# Patient Record
Sex: Male | Born: 1970 | Race: White | Hispanic: No | Marital: Married | Smoking: Former smoker
Health system: Southern US, Community
[De-identification: ages and names within clinical notes are randomized; demographics above are authoritative.]

## PROBLEM LIST (undated history)

## (undated) DIAGNOSIS — T7840XA Allergy, unspecified, initial encounter: Secondary | ICD-10-CM

## (undated) DIAGNOSIS — M545 Low back pain: Secondary | ICD-10-CM

## (undated) HISTORY — PX: VASECTOMY: SHX75

## (undated) HISTORY — DX: Allergy, unspecified, initial encounter: T78.40XA

## (undated) HISTORY — PX: SPINE SURGERY: SHX786

## (undated) HISTORY — DX: Low back pain: M54.5

## (undated) HISTORY — PX: BACK SURGERY: SHX140

---

## 1998-01-30 HISTORY — PX: OTHER SURGICAL HISTORY: SHX169

## 1998-02-16 ENCOUNTER — Encounter: Payer: Self-pay | Admitting: Orthopedic Surgery

## 1998-02-16 ENCOUNTER — Encounter: Payer: Self-pay | Admitting: Emergency Medicine

## 1998-02-16 ENCOUNTER — Emergency Department (HOSPITAL_COMMUNITY): Admission: EM | Admit: 1998-02-16 | Discharge: 1998-02-16 | Payer: Self-pay | Admitting: Emergency Medicine

## 2000-04-11 ENCOUNTER — Encounter: Payer: Self-pay | Admitting: Neurosurgery

## 2000-04-11 ENCOUNTER — Encounter: Admission: RE | Admit: 2000-04-11 | Discharge: 2000-04-11 | Payer: Self-pay | Admitting: Neurosurgery

## 2000-04-20 ENCOUNTER — Ambulatory Visit (HOSPITAL_COMMUNITY): Admission: RE | Admit: 2000-04-20 | Discharge: 2000-04-21 | Payer: Self-pay | Admitting: Neurosurgery

## 2008-04-07 ENCOUNTER — Ambulatory Visit: Payer: Self-pay | Admitting: Family Medicine

## 2008-11-19 ENCOUNTER — Telehealth: Payer: Self-pay | Admitting: Family Medicine

## 2009-04-01 ENCOUNTER — Ambulatory Visit: Payer: Self-pay | Admitting: Family Medicine

## 2009-04-01 DIAGNOSIS — M545 Low back pain, unspecified: Secondary | ICD-10-CM

## 2009-04-01 HISTORY — DX: Low back pain, unspecified: M54.50

## 2009-04-02 ENCOUNTER — Telehealth: Payer: Self-pay | Admitting: Family Medicine

## 2009-04-05 ENCOUNTER — Encounter: Admission: RE | Admit: 2009-04-05 | Discharge: 2009-04-05 | Payer: Self-pay | Admitting: Family Medicine

## 2009-04-06 ENCOUNTER — Telehealth: Payer: Self-pay | Admitting: Family Medicine

## 2009-04-13 ENCOUNTER — Telehealth: Payer: Self-pay | Admitting: Family Medicine

## 2009-04-15 ENCOUNTER — Encounter: Payer: Self-pay | Admitting: Family Medicine

## 2009-04-16 ENCOUNTER — Encounter: Admission: RE | Admit: 2009-04-16 | Discharge: 2009-04-16 | Payer: Self-pay | Admitting: Neurosurgery

## 2009-12-15 ENCOUNTER — Encounter: Admission: RE | Admit: 2009-12-15 | Discharge: 2009-12-15 | Payer: Self-pay | Admitting: Neurosurgery

## 2009-12-16 ENCOUNTER — Telehealth: Payer: Self-pay | Admitting: Family Medicine

## 2009-12-27 ENCOUNTER — Encounter: Payer: Self-pay | Admitting: Family Medicine

## 2010-01-04 ENCOUNTER — Telehealth: Payer: Self-pay | Admitting: Family Medicine

## 2010-03-01 NOTE — Progress Notes (Signed)
Summary: Pt req to get MRI sch. Pt wants Dr. Caryl Never to call first  Phone Note Call from Patient Call back at Work Phone [phone]   Caller: Patient Summary of Call: Pt called and wants Dr. Caryl Never to schedule an MRI. Pt wants Dr. Caryl Never to call pt first to discuss, before MRI is schdule.  Initial call taken by: Lucy Antigua,  April 02, 2009 11:50 AM  Follow-up for Phone Call        called and left message. Follow-up by: Evelena Peat MD,  April 02, 2009 2:41 PM  Additional Follow-up for Phone Call Additional follow up Details #1::        pt is return doc call Additional Follow-up by: Heron Sabins,  April 02, 2009 2:51 PM    Additional Follow-up for Phone Call Additional follow up Details #2::    Spoke with pt.  He has worsening pain into L buttock.  No numbness or wweakness.  No relief with Naprosyn. Will call in Vicodin #60 tablets 1-2 by mouth q4-6 hours as needed pain and try to set up MRI LS spine to further evaluate. Follow-up by: Evelena Peat MD,  April 02, 2009 4:57 PM  Additional Follow-up for Phone Call Additional follow up Details #3:: Details for Additional Follow-up Action Taken: Rx called in Additional Follow-up by: Sid Falcon LPN,  April 02, 2009 5:08 PM  New/Updated Medications: VICODIN 5-500 MG TABS (HYDROCODONE-ACETAMINOPHEN) one to two tabs every 4-6 hours as needed pain Prescriptions: VICODIN 5-500 MG TABS (HYDROCODONE-ACETAMINOPHEN) one to two tabs every 4-6 hours as needed pain  #60 x 0   Entered by:   Sid Falcon LPN   Authorized by:   Evelena Peat MD   Signed by:   Sid Falcon LPN on 09/81/1914   Method used:   Telephoned to ...       [facility] [address] (retail)       [address]       [place], [place]  [address]       Ph: [phone] or [phone]       Fax: [phone]   RxID:   [id number]

## 2010-03-01 NOTE — Consult Note (Signed)
Summary: [facility]  [facility]   Imported By: Maryln Gottron 04/23/2009 13:30:03  _____________________________________________________________________  External Attachment:    Type:   Image     Comment:   External Document

## 2010-03-01 NOTE — Assessment & Plan Note (Signed)
Summary: BACK PAIN // RS   Vital Signs:  Patient profile:   40 year old male Temp:     98.4 degrees F oral BP sitting:   122 / 62  (left arm) Cuff size:   regular  Vitals Entered By: Sid Falcon LPN (April 02, 2954 2:46 PM) CC: Back pain X 2 days   History of Present Illness: Acute visit. Low back pain which started yesterday. Patient started vigorous workout program couple weeks ago. No specific injury. Remote history of lumbosacral disc surgery 2002. Pain is sharp and moderate intensity left lower lumbar region and radiates toward the hamstring. No numbness or weakness. No loss of bladder or bowel control. Pain worse sitting. Lying supine is okay. No difficulties with sleep. Tried Advil without much improvement.  Allergies (verified): No Known Drug Allergies  Past History:  Past Medical History: Last updated: 04/07/2008 Grandmother breast cancer  Past Surgical History: Last updated: 04/07/2008 Broken left leg 2000 Ruptured Disc/Back Sugery 2002  Family History: Last updated: 04/07/2008 grandmother breastcancer  Social History: Last updated: 04/07/2008 Occupation:  Designer, multimedia, [employer] Married Never Smoked Alcohol use-yes  Risk Factors: Exercise: yes (04/07/2008)  Risk Factors: Smoking Status: never (04/07/2008)  Review of Systems  The patient denies anorexia, fever, weight loss, and incontinence.    Physical Exam  General:  Well-developed,well-nourished,in no acute distress; alert,appropriate and cooperative throughout examination Lungs:  Normal respiratory effort, chest expands symmetrically. Lungs are clear to auscultation, no crackles or wheezes. Heart:  Normal rate and regular rhythm. S1 and S2 normal without gallop, murmur, click, rub or other extra sounds. Msk:  no reproducible back tenderness. Left straight leg raise positive, negative right. Extremities:  no edema lower extremities. Neurologic:  full strength lower extremities and 2+  symmetric reflexes. Normal sensory function.   Detailed Back/Spine Exam  Lumbosacral Exam:  Inspection-deformity:    Normal Palpation-spinal tenderness:  Normal Range of Motion:    Forward Flexion:   90 degrees    Hyperextension:   15 degrees Squatting:  normal Lying Straight Leg Raise:    Right:  negative    Left:  positive Sciatic Notch:    There is left sciatic notch tenderness. Toe Walking:    Right:  normal    Left:  normal Heel Walking:    Right:  normal    Left:  normal   Impression & Recommendations:  Problem # 1:  LUMBAGO (ICD-724.2) Avoid heavy lifting and vigorous back flexion for now.  Cont aerobic activitly as tolerated and extension stretchs reviewed.  Trial of Naproxen. His updated medication list for this problem includes:    Naproxen 500 Mg Tabs (Naproxen) ..... One by mouth two times a day with food as needed back pain  Complete Medication List: 1)  Naproxen 500 Mg Tabs (Naproxen) .... One by mouth two times a day with food as needed back pain  Patient Instructions: 1)  Continue aerobic activity as tolerated. 2)  Followup promptly if he notices any numbness or weakness left lower extremity or if back pain increases in intensity. Prescriptions: NAPROXEN 500 MG TABS (NAPROXEN) one by mouth two times a day with food as needed back pain  #40 x 1   Entered and Authorized by:   Evelena Peat MD   Signed by:   Evelena Peat MD on 04/01/2009   Method used:   [facility]ron[facility]        [facility]  [address] [phone]* (retail)       [address]  [place], [place]  [address]       Ph: [phone] or [phone]       Fax: [phone]   RxID:   [id number]

## 2010-03-01 NOTE — [place] Notes (Signed)
Summary: Pt returned call. Pls call back.   Phone Note Call from Patient Call back at Home Phone [phone]   Caller: Patient Summary of Call: Pt called back and said that he was returning a call from Dr. Caryl Never. Pls return call.  Initial call taken by: Lucy Antigua,  January 04, 2010 4:10 PM  Follow-up for Phone Call        spoke with pt.  He has done well status post low back surgery. Follow-up by: Evelena Peat MD,  January 05, 2010 1:20 PM

## 2010-03-01 NOTE — [place] Notes (Signed)
Summary: please return call  Phone Note Call from Patient Call back at Work Phone [phone]   Caller: Patient---live call Summary of Call: Wants Dr Caryl Never to call him personally about a medicine. patient declined to leave further message. Initial call taken by: Warnell Forester,  December 16, 2009 10:25 AM  Follow-up for Phone Call        recurrent back pain.  refill Naproxen. Follow-up by: Evelena Peat MD,  December 16, 2009 2:53 PM    Prescriptions: NAPROXEN 500 MG TABS (NAPROXEN) one by mouth two times a day with food as needed back pain  #40 x 1   Entered and Authorized by:   Evelena Peat MD   Signed by:   Evelena Peat MD on 12/16/2009   Method used:   Electronically to        [facility] [phone]* (retail)       [address]       [place], [place]  [address]       Ph: [phone] or [phone]       Fax: [phone]   RxID:   [id number]

## 2010-03-01 NOTE — [place] Notes (Signed)
  Phone Note Call from Patient Call back at [facility] Phone [phone]   Summary of Call: continued back pain. Sees neurosurgeon Thursday.  Requesting refill Vicodin which provides moderate relief of pain.  [facility] #[facility] [address] with no refills.  No new neurologic sxs.  Pain somewhat worse as prednisone tapered. Initial call taken by: Evelena Peat MD,  April 13, 2009 5:55 PM

## 2010-03-01 NOTE — [place] Notes (Signed)
Summary: REQ FOR RETURN CALL  Phone Note Call from Patient   Caller: Patient Reason for Call: Talk to Doctor Summary of Call: Pt called to speak with Dr Caryl Never..... adv that Dr Caryl Never would know what it was about??? Didn't want to elaborate but adv that he had MRI last night.....  Request return call at [phone].  Initial call taken by: Debbra Riding,  April 06, 2009 11:20 AM  Follow-up for Phone Call        pt notified of MRI results.  Back pain unchanged.  Recurrent disc herniation L5-S1.  Will refer back to Dr Jeral Fruit and place pt on prednisone taper in the meantime and pt agrees.  He has not had any progressive weakness or numbness. Follow-up by: Evelena Peat MD,  April 06, 2009 1:10 PM    New/Updated Medications: PREDNISONE 20 MG TABS (PREDNISONE) taper as follows: 3-3-2-2-2-1-1-1 Prescriptions: PREDNISONE 20 MG TABS (PREDNISONE) taper as follows: 3-3-2-2-2-1-1-1  #15 x 0   Entered and Authorized by:   Evelena Peat MD   Signed by:   Evelena Peat MD on 04/06/2009   Method used:   Electronically to        [facility] [phone]* (retail)       [address]       [place], [place]  [address]       Ph: [phone] or [phone]       Fax: [phone]   RxID:   [id number]

## 2010-03-03 NOTE — Letter (Signed)
Summary: [facility]  [facility]   Imported By: Lanelle Bal 01/21/2010 09:30:43  _____________________________________________________________________  External Attachment:    Type:   Image     Comment:   External Document

## 2010-06-17 NOTE — H&P (Signed)
[place]  Patient:    Todd Santana, Todd Santana                     MRN: [id number] Adm. Date:  [id number] Disc. Date: [id number] Attending:  Danella Penton                         History and Physical  HISTORY OF PRESENT ILLNESS:  [name] is a gentleman who is 40 years old who since October 2001 when he was at work, developed back pain and then later on graduated down to the left leg all the way down to the left foot.  Patient had considerable treatment. He had a MRI and in view of no improvement, he was sent to [place].  He was sent by Dr. Thad Ranger who is a neurologist and after the x-ray, he was sent to [place] for further treatment.  PAST MEDICAL HISTORY:  Surgery on the left foot for fracture.  ALLERGIES:  He is not allergic to any medications.  SOCIAL HISTORY:  He does not smoke.  He drinks socially.  FAMILY HISTORY:  Negative.  REVIEW OF SYSTEMS:  Positive for back and left leg pain.  MEDICATIONS:  He still has been taking Neurontin for pain.  PHYSICAL EXAMINATION:  GENERAL:  The patient came to my office limping from the left leg.  VITAL SIGNS:  He is 6 feet 2 inches.  He weighs 160 pounds.  HEENT:  Normal.  NECK:  Normal.  LUNGS:  Clear.  HEART:  Heart sounds normal.  ABDOMEN:  Normal.  EXTREMITIES:  Normal pulses.  NEUROLOGIC:  Mental status normal.  Cranial nerves normal.  Strength 5/5 except for weakness upon flexion of the left foot, 4/5.  Reflexes are present and although the left one is present, nevertheless, the left ankle jerk is decreased in relation to the right one.  On straight leg raising, the right side is negative _________ , left side positive at 45 degrees.  LABORATORY:  The MRI showed that indeed he has a large herniated disk at the level 5-1, central and to the left.  He also has some degenerative disk disease at the level of 5-1 and 4-5.  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  RECOMMENDATION:  The patient will be admitted for surgery.  He knows the procedure will be a 5-1 diskectomy and foraminotomy.  He knows about the risks such as infection, CSF leak, recurrence, or need for further surgery. DD:  04/20/00 TD:  04/21/00 Job: [id number]

## 2010-06-17 NOTE — Op Note (Signed)
[place]  Patient:    Todd Santana, Todd Santana                     MRN: [id number] Proc. Date: 04/20/00 Adm. Date:  [id number] Disc. Date: [id number] Attending:  Danella Penton                           Operative Report  PREOPERATIVE DIAGNOSIS:  Left L5-S1 herniated disk.  POSTOPERATIVE DIAGNOSIS:  Left L5-S1 herniated disk.  PROCEDURE:  Left L5-S1 diskectomy and foraminotomy.  Microscope.  SURGEON:  Tanya Nones. Jeral Fruit, M.D.  ASSISTANT:  Cristi Loron, M.D.  CLINICAL HISTORY:  Mr. Kucinski is a 40 year old gentleman who back in October while at work developed back and left leg pain.  He had been treated conservatively without improvement.  He was seen by a neurologist, and an MRI showed that indeed he has a large herniated disk, _____ for surgery.  The patient knew about the risks, such as infection, CSF leak, worsening of the pain, need for further surgery, and the possibility of no improvement since he has had radiculopathy for almost five months.  DESCRIPTION OF PROCEDURE:  The patient was taken to the OR, where he was positioned in a prone manner.  The back was prepared with Betadine.  A midline incision between L5 and S1 was made.  Muscle was retracted laterally.  We identified the L5-S1 space.  With the drill, we drilled the lower lamina of L5 and the upper of S1.  We brought the microscope into the area.  We started our dissection to remove the yellow ligament.  We identified the S1 nerve root, which was swollen and which _____ immediately.  Retraction was done, and then we did lysis.  Indeed, there was a large herniated disk.  Incision was made, and total gross diskectomy was achieved.  He has a ridge which was also removed.  At the end, we had a good decompression.  Investigation above and below was negative.  Valsalva maneuver was negative.  From then on, the area was irrigated.  Fentanyl and Depo-Medrol were left in the  epidural space, and the wound was closed with Vicryl and a Steri-Strip. DD:  04/20/00 TD:  04/23/00 Job: [id number]

## 2011-03-06 ENCOUNTER — Encounter: Payer: Self-pay | Admitting: Family Medicine

## 2011-03-06 ENCOUNTER — Ambulatory Visit (INDEPENDENT_AMBULATORY_CARE_PROVIDER_SITE_OTHER): Payer: BC Managed Care – PPO | Admitting: Family Medicine

## 2011-03-06 VITALS — BP 120/70 | Temp 98.2°F | Wt 177.0 lb

## 2011-03-06 DIAGNOSIS — J310 Chronic rhinitis: Secondary | ICD-10-CM

## 2011-03-06 NOTE — [place] Notes (Signed)
  Subjective:    Patient ID: Todd Santana, male    DOB: 1970-06-20, 41 y.o.   MRN: [id number]  HPI  Patient seen with onset of nasal congestion about last Thursday. He denies any fever or chills. Minimal  sore throat. No cough. Increased malaise. Denies any nausea, vomiting, or diarrhea. No sick contacts. Took some type of over-the-counter medication without much. Frequent postnasal drip symptoms. Has history of some perennial and seasonal allergies. Currently not taking any medications for that.  Review of Systems  Constitutional: Negative for fever and chills.  HENT: Positive for congestion. Negative for sore throat.   Respiratory: Negative for cough.   Cardiovascular: Negative for chest pain.  Neurological: Negative for headaches.  Hematological: Negative for adenopathy.       Objective:   Physical Exam  Constitutional: He appears well-developed and well-nourished.  HENT:  Right Ear: External ear normal.  Left Ear: External ear normal.  Mouth/Throat: Oropharynx is clear and moist.  Neck: Neck supple.  Cardiovascular: Normal rate and regular rhythm.   Pulmonary/Chest: Effort normal and breath sounds normal. No respiratory distress. He has no wheezes. He has no rales.  Lymphadenopathy:    He has no cervical adenopathy.          Assessment & Plan:  Rhinitis. Differential is allergic versus viral. Try over-the-counter Allegra 180 mg one daily. Consider nasal steroid spray if not adequate relief with that. No indication for antibiotic at this time

## 2011-03-06 NOTE — Patient Instructions (Signed)
Try over the counter Allegra (fexofenadine) one daily 

## 2012-01-10 ENCOUNTER — Telehealth: Payer: Self-pay | Admitting: Family Medicine

## 2012-01-10 NOTE — Telephone Encounter (Signed)
Spoke with patient. Both he and several family members with viral type illness with fever, congestion, cough. Patient's fever is actually better. He would like for his wife to be seen as a work in for acute new patient visit today. We will try to accommodate.

## 2012-01-10 NOTE — Telephone Encounter (Signed)
Patient called stating that he would like a call back from the MD. Please assist.

## 2012-02-20 ENCOUNTER — Ambulatory Visit (INDEPENDENT_AMBULATORY_CARE_PROVIDER_SITE_OTHER): Payer: BC Managed Care – PPO | Admitting: Family Medicine

## 2012-02-20 ENCOUNTER — Encounter: Payer: Self-pay | Admitting: Family Medicine

## 2012-02-20 VITALS — BP 130/90 | Temp 97.8°F | Wt 172.0 lb

## 2012-02-20 DIAGNOSIS — R51 Headache: Secondary | ICD-10-CM

## 2012-02-20 NOTE — Patient Instructions (Addendum)
Temporomandibular Joint Pain  Your exam shows that you have a problem with your temporomandibular joint (TMJ), the joint that moves when you open your mouth or chew food. TMJ problems can result from direct injuries, bite abnormalities, or tension states which cause you to grind or clench your teeth. Typical symptoms include pain around the joint, clicking, restricted movement, and headaches.  The TMJ is like any other joint in the body; when it is strained, it needs rest to repair itself. To keep the joint at rest it is important that you do not open your mouth wider than the width of your index finger. If you must yawn, be sure to support your chin with your hand so your mouth does not open wide. Eat a soft diet (nothing firmer than ground beef, no raw vegetables), do not chew gum and do not talk if it causes you pain.  Apply topical heat by using a warm, moist cloth placed in front of the ear for 15 to 20 minutes several times daily. Alternating heat and ice may give even more relief. Anti-inflammatory pain medicine and muscle relaxants can also be helpful. A dental orthotic or splint may be used for temporary relief. Long-term problems may require treatment for stress as well as braces or surgery. Please check with your doctor or dentist if your symptoms do not improve within one week.  Document Released: 02/24/2004 Document Revised: 04/10/2011 Document Reviewed: 01/16/2005  ExitCare® Patient Information ©2013 [employer].

## 2012-02-20 NOTE — [place] Notes (Signed)
  Subjective:    Patient ID: Todd Santana, male    DOB: Jun 10, 1970, 42 y.o.   MRN: [id number]  HPI  Left ear pain off and on for several months. Symptoms are very intermittent. Pain frequently anterior to ear. Sharp at times. No drainage. No hearing changes. No vertigo or tinnitus. Occasional radiation toward left temporal region. Ibuprofen with mild relief. Does sometimes have worsening with eating/chewing. No sore throat. No lymphadenopathy  Review of Systems  Constitutional: Negative for fever, chills, appetite change and unexpected weight change.  HENT: Negative for sore throat and trouble swallowing.   Respiratory: Negative for cough.   Skin: Negative for rash.  Neurological: Negative for headaches.  Hematological: Negative for adenopathy.       Objective:   Physical Exam  Constitutional: He appears well-developed and well-nourished. No distress.  HENT:  Right Ear: External ear normal.  Left Ear: External ear normal.  Mouth/Throat: Oropharynx is clear and moist.       Patient's tenderness left TMJ joint which is minimal Gums appear normal.  Neck: Neck supple.  Cardiovascular: Normal rate and regular rhythm.   Pulmonary/Chest: Effort normal and breath sounds normal. No respiratory distress. He has no wheezes. He has no rales.  Lymphadenopathy:    He has no cervical adenopathy.          Assessment & Plan:  Left facial pain. 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 Doubt trigeminal neuralgia. No evidence for infection. Avoid hard to chew foods. Continue anti-inflammatories as needed. No history of bruxism. Consider referral to oral surgeon if symptoms persist

## 2013-02-11 ENCOUNTER — Other Ambulatory Visit (INDEPENDENT_AMBULATORY_CARE_PROVIDER_SITE_OTHER): Payer: BC Managed Care – PPO

## 2013-02-11 DIAGNOSIS — Z0000 Encounter for general adult medical examination without abnormal findings: Secondary | ICD-10-CM

## 2013-02-11 LAB — BASIC METABOLIC PANEL
BUN: 23 mg/dL (ref 6–23)
CO2: 27 mEq/L (ref 19–32)
Calcium: 9.4 mg/dL (ref 8.4–10.5)
Chloride: 104 mEq/L (ref 96–112)
Creatinine, Ser: 1.1 mg/dL (ref 0.4–1.5)
GFR: 82.54 mL/min (ref >60.00)
Glucose, Bld: 82 mg/dL (ref 70–99)
Potassium: 4.1 mEq/L (ref 3.5–5.1)
Sodium: 139 mEq/L (ref 135–145)

## 2013-02-11 LAB — CBC WITH DIFFERENTIAL/PLATELET
Basophils Absolute: 0 10*3/uL (ref 0.0–0.1)
Basophils Relative: 0.6 % (ref 0.0–3.0)
EOS PCT: 1.2 % (ref 0.0–5.0)
Eosinophils Absolute: 0.1 10*3/uL (ref 0.0–0.7)
HCT: 46 % (ref 39.0–52.0)
Hemoglobin: 15.8 g/dL (ref 13.0–17.0)
LYMPHS PCT: 38.7 % (ref 12.0–46.0)
Lymphs Abs: 2.2 10*3/uL (ref 0.7–4.0)
MCHC: 34.2 g/dL (ref 30.0–36.0)
MCV: 92.7 fl (ref 78.0–100.0)
Monocytes Absolute: 0.5 10*3/uL (ref 0.1–1.0)
Monocytes Relative: 8.5 % (ref 3.0–12.0)
NEUTROS PCT: 51 % (ref 43.0–77.0)
Neutro Abs: 2.9 10*3/uL (ref 1.4–7.7)
Platelets: 217 10*3/uL (ref 150.0–400.0)
RBC: 4.97 Mil/uL (ref 4.22–5.81)
RDW: 12.7 % (ref 11.5–14.6)
WBC: 5.6 10*3/uL (ref 4.5–10.5)

## 2013-02-11 LAB — POCT URINALYSIS DIPSTICK
BILIRUBIN UA: NEGATIVE
Blood, UA: NEGATIVE
Glucose, UA: NEGATIVE
KETONES UA: NEGATIVE
LEUKOCYTES UA: NEGATIVE
Nitrite, UA: NEGATIVE
PH UA: 6
Protein, UA: NEGATIVE
Spec Grav, UA: >=1.03
Urobilinogen, UA: 0.2

## 2013-02-11 LAB — LIPID PANEL
CHOLESTEROL: 241 mg/dL — AB (ref 0–200)
HDL: 65.9 mg/dL (ref >39.00)
Total CHOL/HDL Ratio: 4
Triglycerides: 53 mg/dL (ref 0.0–149.0)
VLDL: 10.6 mg/dL (ref 0.0–40.0)

## 2013-02-11 LAB — HEPATIC FUNCTION PANEL
ALT: 20 U/L (ref 0–53)
AST: 19 U/L (ref 0–37)
Albumin: 4.4 g/dL (ref 3.5–5.2)
Alkaline Phosphatase: 42 U/L (ref 39–117)
BILIRUBIN DIRECT: 0 mg/dL (ref 0.0–0.3)
Total Bilirubin: 0.9 mg/dL (ref 0.3–1.2)
Total Protein: 7.4 g/dL (ref 6.0–8.3)

## 2013-02-11 LAB — LDL CHOLESTEROL, DIRECT: Direct LDL: 158.7 mg/dL

## 2013-02-11 LAB — TSH: TSH: 2.41 u[IU]/mL (ref 0.35–5.50)

## 2013-02-20 ENCOUNTER — Encounter: Payer: Self-pay | Admitting: Family Medicine

## 2013-02-20 ENCOUNTER — Ambulatory Visit (INDEPENDENT_AMBULATORY_CARE_PROVIDER_SITE_OTHER): Payer: BC Managed Care – PPO | Admitting: Family Medicine

## 2013-02-20 VITALS — BP 130/74 | HR 80 | Temp 97.6°F | Ht 73.5 in | Wt 172.0 lb

## 2013-02-20 DIAGNOSIS — Z0000 Encounter for general adult medical examination without abnormal findings: Secondary | ICD-10-CM

## 2013-02-20 NOTE — [place] Notes (Signed)
   Subjective:    Patient ID: Todd Santana, male    DOB: [birth year], 43 y.o.   MRN: [id number]  HPI Patient seen for complete physical. Generally very healthy. He takes no medications. Back surgery 3 years ago and has done well since then. He has joined a gym and plans to start more consistent exercise soon. No history of smoking. No regular alcohol use.  Family history is relatively unknown regarding his father. Mother has no major health problems. He had grandmother with breast cancer.  Past Medical History  Diagnosis Date  . Lumbago 04/01/2009   Past Surgical History  Procedure Laterality Date  . Spine surgery      ruptured disk 2002  . Broken leg  2000    reports that he has never smoked. He does not have any smokeless tobacco history on file. His alcohol and drug histories are not on file. family history includes Cancer in his other. No Known Allergies    Review of Systems  Constitutional: Negative for fever, activity change, appetite change and fatigue.  HENT: Negative for congestion, ear pain and trouble swallowing.   Eyes: Negative for pain and visual disturbance.  Respiratory: Negative for cough, shortness of breath and wheezing.   Cardiovascular: Negative for chest pain and palpitations.  Gastrointestinal: Negative for nausea, vomiting, abdominal pain, diarrhea, constipation, blood in stool, abdominal distention and rectal pain.  Genitourinary: Negative for dysuria, hematuria and testicular pain.  Musculoskeletal: Negative for arthralgias and joint swelling.  Skin: Negative for rash.  Neurological: Negative for dizziness, syncope and headaches.  Hematological: Negative for adenopathy.  Psychiatric/Behavioral: Negative for confusion and dysphoric mood.       Objective:   Physical Exam  Constitutional: He is oriented to person, place, and time. He appears well-developed and well-nourished. No distress.  HENT:  Head: Normocephalic and atraumatic.  Right Ear:  External ear normal.  Left Ear: External ear normal.  Mouth/Throat: Oropharynx is clear and moist.  Eyes: Conjunctivae and EOM are normal. Pupils are equal, round, and reactive to light.  Neck: Normal range of motion. Neck supple. No thyromegaly present.  Cardiovascular: Normal rate, regular rhythm and normal heart sounds.   No murmur heard. Pulmonary/Chest: No respiratory distress. He has no wheezes. He has no rales.  Abdominal: Soft. Bowel sounds are normal. He exhibits no distension and no mass. There is no tenderness. There is no rebound and no guarding.  Musculoskeletal: He exhibits no edema.  Lymphadenopathy:    He has no cervical adenopathy.  Neurological: He is alert and oriented to person, place, and time. He displays normal reflexes. No cranial nerve deficit.  Skin: No rash noted.  Psychiatric: He has a normal mood and affect.          Assessment & Plan:  Complete physical. Labs reviewed. He has elevated cholesterol but excellent HDL. 2% 10 year risk of CAD event. We reviewed cholesterol control diet. Continue regular exercise habits. Tetanus up-to-date.

## 2013-02-20 NOTE — Patient Instructions (Signed)
Fat and Cholesterol Control Diet Fat and cholesterol levels in your blood and organs are influenced by your diet. High levels of fat and cholesterol may lead to diseases of the heart, small and large blood vessels, gallbladder, liver, and pancreas. CONTROLLING FAT AND CHOLESTEROL WITH DIET Although exercise and lifestyle factors are important, your diet is key. That is because certain foods are known to raise cholesterol and others to lower it. The goal is to balance foods for their effect on cholesterol and more importantly, to replace saturated and trans fat with other types of fat, such as monounsaturated fat, polyunsaturated fat, and omega-3 fatty acids. On average, a person should consume no more than 15 to 17 g of saturated fat daily. Saturated and trans fats are considered "bad" fats, and they will raise LDL cholesterol. Saturated fats are primarily found in animal products such as meats, butter, and cream. However, that does not mean you need to give up all your favorite foods. Today, there are good tasting, low-fat, low-cholesterol substitutes for most of the things you like to eat. Choose low-fat or nonfat alternatives. Choose round or loin cuts of red meat. These types of cuts are lowest in fat and cholesterol. Chicken (without the skin), fish, veal, and ground turkey breast are great choices. Eliminate fatty meats, such as hot dogs and salami. Even shellfish have little or no saturated fat. Have a 3 oz (85 g) portion when you eat lean meat, poultry, or fish. Trans fats are also called "partially hydrogenated oils." They are oils that have been scientifically manipulated so that they are solid at room temperature resulting in a longer shelf life and improved taste and texture of foods in which they are added. Trans fats are found in stick margarine, some tub margarines, cookies, crackers, and baked goods.  When baking and cooking, oils are a great substitute for butter. The monounsaturated oils are  especially beneficial since it is believed they lower LDL and raise HDL. The oils you should avoid entirely are saturated tropical oils, such as coconut and palm.  Remember to eat a lot from food groups that are naturally free of saturated and trans fat, including fish, fruit, vegetables, beans, grains (barley, rice, couscous, bulgur wheat), and pasta (without cream sauces).  IDENTIFYING FOODS THAT LOWER FAT AND CHOLESTEROL  Soluble fiber may lower your cholesterol. This type of fiber is found in fruits such as apples, vegetables such as broccoli, potatoes, and carrots, legumes such as beans, peas, and lentils, and grains such as barley. Foods fortified with plant sterols (phytosterol) may also lower cholesterol. You should eat at least 2 g per day of these foods for a cholesterol lowering effect.  Read package labels to identify low-saturated fats, trans fat free, and low-fat foods at the supermarket. Select cheeses that have only 2 to 3 g saturated fat per ounce. Use a heart-healthy tub margarine that is free of trans fats or partially hydrogenated oil. When buying baked goods (cookies, crackers), avoid partially hydrogenated oils. Breads and muffins should be made from whole grains (whole-wheat or whole oat flour, instead of "flour" or "enriched flour"). Buy non-creamy canned soups with reduced salt and no added fats.  FOOD PREPARATION TECHNIQUES  Never deep-fry. If you must fry, either stir-fry, which uses very little fat, or use non-stick cooking sprays. When possible, broil, bake, or roast meats, and steam vegetables. Instead of putting butter or margarine on vegetables, use lemon and herbs, applesauce, and cinnamon (for squash and sweet potatoes). Use nonfat   yogurt, salsa, and low-fat dressings for salads.  LOW-SATURATED FAT / LOW-FAT FOOD SUBSTITUTES Meats / Saturated Fat (g)  Avoid: Steak, marbled (3 oz/85 g) / 11 g  Choose: Steak, lean (3 oz/85 g) / 4 g  Avoid: Hamburger (3 oz/85 g) / 7  g  Choose: Hamburger, lean (3 oz/85 g) / 5 g  Avoid: Ham (3 oz/85 g) / 6 g  Choose: Ham, lean cut (3 oz/85 g) / 2.4 g  Avoid: Chicken, with skin, dark meat (3 oz/85 g) / 4 g  Choose: Chicken, skin removed, dark meat (3 oz/85 g) / 2 g  Avoid: Chicken, with skin, light meat (3 oz/85 g) / 2.5 g  Choose: Chicken, skin removed, light meat (3 oz/85 g) / 1 g Dairy / Saturated Fat (g)  Avoid: Whole milk (1 cup) / 5 g  Choose: Low-fat milk, 2% (1 cup) / 3 g  Choose: Low-fat milk, 1% (1 cup) / 1.5 g  Choose: Skim milk (1 cup) / 0.3 g  Avoid: Hard cheese (1 oz/28 g) / 6 g  Choose: Skim milk cheese (1 oz/28 g) / 2 to 3 g  Avoid: Cottage cheese, 4% fat (1 cup) / 6.5 g  Choose: Low-fat cottage cheese, 1% fat (1 cup) / 1.5 g  Avoid: Ice cream (1 cup) / 9 g  Choose: Sherbet (1 cup) / 2.5 g  Choose: Nonfat frozen yogurt (1 cup) / 0.3 g  Choose: Frozen fruit bar / trace  Avoid: Whipped cream (1 tbs) / 3.5 g  Choose: Nondairy whipped topping (1 tbs) / 1 g Condiments / Saturated Fat (g)  Avoid: Mayonnaise (1 tbs) / 2 g  Choose: Low-fat mayonnaise (1 tbs) / 1 g  Avoid: Butter (1 tbs) / 7 g  Choose: Extra light margarine (1 tbs) / 1 g  Avoid: Coconut oil (1 tbs) / 11.8 g  Choose: Olive oil (1 tbs) / 1.8 g  Choose: Corn oil (1 tbs) / 1.7 g  Choose: Safflower oil (1 tbs) / 1.2 g  Choose: Sunflower oil (1 tbs) / 1.4 g  Choose: Soybean oil (1 tbs) / 2.4 g  Choose: Canola oil (1 tbs) / 1 g Document Released: 01/16/2005 Document Revised: 05/13/2012 Document Reviewed: 07/07/2010 [employer] Patient Information 2014 [place], [place]. Moles Moles are usually harmless growths on the skin. They are accumulations of color (pigment) cells in the skin that:   Can be various colors, from light brown to black.  Can appear anywhere on the body.  May remain flat or become raised.  May contain hairs.  May remain smooth or develop wrinkling. Most moles are not cancerous (benign).  However, some moles may develop changes and become cancerous. It is important to check your moles every month. If you check your moles regularly, you will be able to notice any changes that may occur.  CAUSES  Moles occur when skin cells grow together in clusters instead of spreading out in the skin as they normally do. The reason for this clustering is unknown. DIAGNOSIS  Your caregiver will perform a skin examination to diagnose your mole.  TREATMENT  Moles usually do not require treatment. If a mole becomes worrisome, your caregiver may choose to take a sample of the mole or remove it entirely, and then send it to a lab for examination.  HOME CARE INSTRUCTIONS  Check your mole(s) monthly for changes that may indicate skin cancer. These changes can include:  A change in size.  A change in color.  Note that moles tend to darken during pregnancy or when taking birth control pills (oral contraception).  A change in shape.  A change in the border of the mole.  Wear sunscreen (with an SPF of at least 8) when you spend long periods of time outside. Reapply the sunscreen every 2 3 hours.  Schedule annual appointments with your skin doctor (dermatologist) if you have a large number of moles. SEEK MEDICAL CARE IF:  Your mole changes size, especially if it becomes larger than a pencil eraser.  Your mole changes in color or develops more than one color.  Your mole becomes itchy or bleeds.  Your mole, or the skin near the mole, becomes painful, sore, red, or swollen.  Your mole becomes scaly, sheds skin, or oozes fluid.  Your mole develops irregular borders.  Your mole becomes flat or develops raised areas.  Your mole becomes hard or soft. Document Released: 10/11/2000 Document Revised: 10/11/2011 Document Reviewed: 07/31/2011 [facility] Patient Information 2014 [place].

## 2013-02-20 NOTE — Progress Notes (Signed)
Pre visit review using our clinic review tool, if applicable. No additional management support is needed unless otherwise documented below in the visit note. 

## 2013-07-28 ENCOUNTER — Ambulatory Visit (INDEPENDENT_AMBULATORY_CARE_PROVIDER_SITE_OTHER): Payer: BC Managed Care – PPO | Admitting: Family Medicine

## 2013-07-28 ENCOUNTER — Encounter: Payer: Self-pay | Admitting: Family Medicine

## 2013-07-28 VITALS — BP 130/80 | HR 88 | Temp 97.6°F | Wt 175.0 lb

## 2013-07-28 DIAGNOSIS — J209 Acute bronchitis, unspecified: Secondary | ICD-10-CM

## 2013-07-28 MED ORDER — HYDROCODONE-HOMATROPINE 5-1.5 MG/5ML PO SYRP: 5.0000 mL | ORAL_SOLUTION | Freq: Four times a day (QID) | ORAL | Status: AC | PRN

## 2013-07-28 NOTE — Patient Instructions (Signed)
Acute Bronchitis °Bronchitis is inflammation of the airways that extend from the windpipe into the lungs (bronchi). The inflammation often causes mucus to develop. This leads to a cough, which is the most common symptom of bronchitis.  °In acute bronchitis, the condition usually develops suddenly and goes away over time, usually in a couple weeks. Smoking, allergies, and asthma can make bronchitis worse. Repeated episodes of bronchitis may cause further lung problems.  °CAUSES °Acute bronchitis is most often caused by the same virus that causes a cold. The virus can spread from person to person (contagious).  °SIGNS AND SYMPTOMS  °· Cough.   °· Fever.   °· Coughing up mucus.   °· Body aches.   °· Chest congestion.   °· Chills.   °· Shortness of breath.   °· Sore throat.   °DIAGNOSIS  °Acute bronchitis is usually diagnosed through a physical exam. Tests, such as chest X-rays, are sometimes done to rule out other conditions.  °TREATMENT  °Acute bronchitis usually goes away in a couple weeks. Often times, no medical treatment is necessary. Medicines are sometimes given for relief of fever or cough. Antibiotics are usually not needed but may be prescribed in certain situations. In some cases, an inhaler may be recommended to help reduce shortness of breath and control the cough. A cool mist vaporizer may also be used to help thin bronchial secretions and make it easier to clear the chest.  °HOME CARE INSTRUCTIONS °· Get plenty of rest.   °· Drink enough fluids to keep your urine clear or pale yellow (unless you have a medical condition that requires fluid restriction). Increasing fluids may help thin your secretions and will prevent dehydration.   °· Only take over-the-counter or prescription medicines as directed by your health care Todd Santana.   °· Avoid smoking and secondhand smoke. Exposure to cigarette smoke or irritating chemicals will make bronchitis worse. If you are a smoker, consider using nicotine gum or skin  patches to help control withdrawal symptoms. Quitting smoking will help your lungs heal faster.   °· Reduce the chances of another bout of acute bronchitis by washing your hands frequently, avoiding people with cold symptoms, and trying not to touch your hands to your mouth, nose, or eyes.   °· Follow up with your health care Todd Santana as directed.   °SEEK MEDICAL CARE IF: °Your symptoms do not improve after 1 week of treatment.  °SEEK IMMEDIATE MEDICAL CARE IF: °· You develop an increased fever or chills.   °· You have chest pain.   °· You have severe shortness of breath. °· You have bloody sputum.   °· You develop dehydration. °· You develop fainting. °· You develop repeated vomiting. °· You develop a severe headache. °MAKE SURE YOU:  °· Understand these instructions. °· Will watch your condition. °· Will get help right away if you are not doing well or get worse. °Document Released: 02/24/2004 Document Revised: 09/18/2012 Document Reviewed: 07/09/2012 °ExitCare® Patient Information ©2015 [employer]. This information is not intended to replace advice given to you by your health care Todd Santana. Make sure you discuss any questions you have with your health care Todd Santana. ° °

## 2013-07-28 NOTE — Progress Notes (Signed)
Pre visit review using our clinic review tool, if applicable. No additional management support is needed unless otherwise documented below in the visit note. 

## 2013-07-28 NOTE — Progress Notes (Signed)
   Subjective:    Patient ID: Todd Santana, male    DOB: [birth year], 43 y.o.   MRN: [id number]  Cough Associated symptoms include headaches. Pertinent negatives include no chills, fever, shortness of breath or wheezing.   Acute visit. Patient is nonsmoker seen with onset last week of cough and chest congestion. No fever. No wheezes. Occasional headaches. No dyspnea. He had some minimal nasal congestive symptoms. He tried some type of over-the-counter cough medicine without much improvement. No sick contacts.  Past Medical History  Diagnosis Date  . Lumbago 04/01/2009   Past Surgical History  Procedure Laterality Date  . Spine surgery      ruptured disk 2002  . Broken leg  2000    reports that he has never smoked. He does not have any smokeless tobacco history on file. His alcohol and drug histories are not on file. family history includes Cancer in his other. No Known Allergies    Review of Systems  Constitutional: Negative for fever and chills.  Respiratory: Positive for cough. Negative for shortness of breath and wheezing.   Neurological: Positive for headaches.       Objective:   Physical Exam  Constitutional: He appears well-developed and well-nourished.  HENT:  Right Ear: External ear normal.  Left Ear: External ear normal.  Mouth/Throat: Oropharynx is clear and moist.  Neck: Neck supple.  Cardiovascular: Normal rate and regular rhythm.   Pulmonary/Chest: Effort normal and breath sounds normal. No respiratory distress. He has no wheezes. He has no rales.  Lymphadenopathy:    He has no cervical adenopathy.          Assessment & Plan:  Acute bronchitis. Suspect viral. Hycodan cough syrup for nighttime use as needed. Followup promptly for fever or persistent or worsening symptoms

## 2013-11-12 ENCOUNTER — Telehealth: Payer: Self-pay | Admitting: Family Medicine

## 2013-11-12 DIAGNOSIS — Z3009 Encounter for other general counseling and advice on contraception: Secondary | ICD-10-CM

## 2013-11-12 NOTE — Telephone Encounter (Signed)
Pt requesting referral for urologic consultation for possible vasectomy. We will set up with [facility].

## 2014-05-12 ENCOUNTER — Other Ambulatory Visit: Payer: Self-pay | Admitting: Orthopedic Surgery

## 2014-05-18 NOTE — Pre-Procedure Instructions (Signed)
BRAINARD HIGHFILL  05/18/2014   Your procedure is scheduled on:  Wed, April 20 @ 8:30 AM  Report to [facility] Entrance A and go to Admitting  at 6:30 AM.  Call this number if you have problems the morning of surgery: [phone]   Remember:   Do not eat food or drink liquids after midnight.   Take these medicines the morning of surgery with A SIP OF WATER: Pain Pill(if needed)              No Goody's,BC's,Aleve,Aspirin,Ibuprofen,Fish Oil,or any Herbal Medications.   Do not wear jewelry.  Do not wear lotions, powders, or colognes. You may wear deodorant.  Men may shave face and neck.  Do not bring valuables to the hospital.  [facility] is not responsible                  for any belongings or valuables.               Contacts, dentures or bridgework may not be worn into surgery.  Leave suitcase in the car. After surgery it may be brought to your room.  For patients admitted to the hospital, discharge time is determined by your                treatment team.                 Special Instructions:   - Preparing for Surgery  Before surgery, you can play an important role.  Because skin is not sterile, your skin needs to be as free of germs as possible.  You can reduce the number of germs on you skin by washing with CHG (chlorahexidine gluconate) soap before surgery.  CHG is an antiseptic cleaner which kills germs and bonds with the skin to continue killing germs even after washing.  Please DO NOT use if you have an allergy to CHG or antibacterial soaps.  If your skin becomes reddened/irritated stop using the CHG and inform your nurse when you arrive at Short Stay.  Do not shave (including legs and underarms) for at least 48 hours prior to the first CHG shower.  You may shave your face.  Please follow these instructions carefully:   1.  Shower with CHG Soap the night before surgery and the                                morning of Surgery.  2.  If you choose to wash your  hair, wash your hair first as usual with your       normal shampoo.  3.  After you shampoo, rinse your hair and body thoroughly to remove the                      Shampoo.  4.  Use CHG as you would any other liquid soap.  You can apply chg directly       to the skin and wash gently with scrungie or a clean washcloth.  5.  Apply the CHG Soap to your body ONLY FROM THE NECK DOWN.        Do not use on open wounds or open sores.  Avoid contact with your eyes,       ears, mouth and genitals (private parts).  Wash genitals (private parts)       with your normal soap.  6.  Wash thoroughly, paying special attention to the area where your surgery        will be performed.  7.  Thoroughly rinse your body with warm water from the neck down.  8.  DO NOT shower/wash with your normal soap after using and rinsing off       the CHG Soap.  9.  Pat yourself dry with a clean towel.            10.  Wear clean pajamas.            11.  Place clean sheets on your bed the night of your first shower and do not        sleep with pets.  Day of Surgery  Do not apply any lotions/deoderants the morning of surgery.  Please wear clean clothes to the hospital/surgery center.  [place] - Preparing for Surgery  Before surgery, you can play an important role.  Because skin is not sterile, your skin needs to be as free of germs as possible.  You can reduce the number of germs on you skin by washing with CHG (chlorahexidine gluconate) soap before surgery.  CHG is an antiseptic cleaner which kills germs and bonds with the skin to continue killing germs even after washing.  Please DO NOT use if you have an allergy to CHG or antibacterial soaps.  If your skin becomes reddened/irritated stop using the CHG and inform your nurse when you arrive at Short Stay.  Do not shave (including legs and underarms) for at least 48 hours prior to the first CHG shower.  You may shave your face.  Please follow these instructions carefully:   1.   Shower with CHG Soap the night before surgery and the                                morning of Surgery.  2.  If you choose to wash your hair, wash your hair first as usual with your       normal shampoo.  3.  After you shampoo, rinse your hair and body thoroughly to remove the                      Shampoo.  4.  Use CHG as you would any other liquid soap.  You can apply chg directly       to the skin and wash gently with scrungie or a clean washcloth.  5.  Apply the CHG Soap to your body ONLY FROM THE NECK DOWN.        Do not use on open wounds or open sores.  Avoid contact with your eyes,       ears, mouth and genitals (private parts).  Wash genitals (private parts)       with your normal soap.  6.  Wash thoroughly, paying special attention to the area where your surgery        will be performed.  7.  Thoroughly rinse your body with warm water from the neck down.  8.  DO NOT shower/wash with your normal soap after using and rinsing off       the CHG Soap.  9.  Pat yourself dry with a clean towel.            10.  Wear clean pajamas.            11.  Place  clean sheets on your bed the night of your first shower and do not        sleep with pets.  Day of Surgery  Do not apply any lotions/deoderants the morning of surgery.  Please wear clean clothes to the hospital/surgery center.     Please read over the following fact sheets that you were given: Pain Booklet, Coughing and Deep Breathing, Blood Transfusion Information, MRSA Information and Surgical Site Infection Prevention

## 2014-05-19 ENCOUNTER — Ambulatory Visit (HOSPITAL_COMMUNITY)
Admission: RE | Admit: 2014-05-19 | Discharge: 2014-05-19 | Disposition: A | Discharge status: Home / Self Care | Payer: BLUE CROSS/BLUE SHIELD | Source: Ambulatory Visit | Attending: Orthopedic Surgery | Admitting: Orthopedic Surgery

## 2014-05-19 ENCOUNTER — Encounter (HOSPITAL_COMMUNITY)
Admission: RE | Admit: 2014-05-19 | Discharge: 2014-05-19 | Disposition: A | Discharge status: Home / Self Care | Payer: BLUE CROSS/BLUE SHIELD | Source: Ambulatory Visit | Attending: Orthopedic Surgery | Admitting: Orthopedic Surgery

## 2014-05-19 ENCOUNTER — Encounter (HOSPITAL_COMMUNITY): Payer: Self-pay

## 2014-05-19 DIAGNOSIS — Z01818 Encounter for other preprocedural examination: Secondary | ICD-10-CM

## 2014-05-19 DIAGNOSIS — M545 Low back pain: Secondary | ICD-10-CM

## 2014-05-19 DIAGNOSIS — Z01810 Encounter for preprocedural cardiovascular examination: Secondary | ICD-10-CM | POA: Insufficient documentation

## 2014-05-19 DIAGNOSIS — Z01812 Encounter for preprocedural laboratory examination: Secondary | ICD-10-CM | POA: Insufficient documentation

## 2014-05-19 DIAGNOSIS — Z87891 Personal history of nicotine dependence: Secondary | ICD-10-CM

## 2014-05-19 DIAGNOSIS — J449 Chronic obstructive pulmonary disease, unspecified: Secondary | ICD-10-CM

## 2014-05-19 LAB — URINALYSIS, ROUTINE W REFLEX MICROSCOPIC
BILIRUBIN URINE: NEGATIVE
Glucose, UA: NEGATIVE mg/dL
HGB URINE DIPSTICK: NEGATIVE
Ketones, ur: NEGATIVE mg/dL
LEUKOCYTES UA: NEGATIVE
NITRITE: NEGATIVE
PH: 5.5 (ref 5.0–8.0)
Protein, ur: NEGATIVE mg/dL
Specific Gravity, Urine: 1.027 (ref 1.005–1.030)
UROBILINOGEN UA: 0.2 mg/dL (ref 0.0–1.0)

## 2014-05-19 LAB — COMPREHENSIVE METABOLIC PANEL
ALBUMIN: 3.9 g/dL (ref 3.5–5.2)
ALT: 18 U/L (ref 0–53)
ANION GAP: 7 (ref 5–15)
AST: 20 U/L (ref 0–37)
Alkaline Phosphatase: 46 U/L (ref 39–117)
BUN: 20 mg/dL (ref 6–23)
CALCIUM: 9.4 mg/dL (ref 8.4–10.5)
CO2: 30 mmol/L (ref 19–32)
CREATININE: 1.03 mg/dL (ref 0.50–1.35)
Chloride: 101 mmol/L (ref 96–112)
GFR calc Af Amer: >90 mL/min (ref >90)
GFR calc non Af Amer: 88 mL/min — ABNORMAL LOW (ref >90)
Glucose, Bld: 93 mg/dL (ref 70–99)
Potassium: 3.6 mmol/L (ref 3.5–5.1)
SODIUM: 138 mmol/L (ref 135–145)
TOTAL PROTEIN: 6.9 g/dL (ref 6.0–8.3)
Total Bilirubin: 1 mg/dL (ref 0.3–1.2)

## 2014-05-19 LAB — CBC WITH DIFFERENTIAL/PLATELET
BASOS ABS: 0 10*3/uL (ref 0.0–0.1)
Basophils Relative: 1 % (ref 0–1)
EOS PCT: 2 % (ref 0–5)
Eosinophils Absolute: 0.1 10*3/uL (ref 0.0–0.7)
HEMATOCRIT: 42.6 % (ref 39.0–52.0)
Hemoglobin: 14.6 g/dL (ref 13.0–17.0)
Lymphocytes Relative: 32 % (ref 12–46)
Lymphs Abs: 1.8 10*3/uL (ref 0.7–4.0)
MCH: 30.6 pg (ref 26.0–34.0)
MCHC: 34.3 g/dL (ref 30.0–36.0)
MCV: 89.3 fL (ref 78.0–100.0)
Monocytes Absolute: 0.7 10*3/uL (ref 0.1–1.0)
Monocytes Relative: 12 % (ref 3–12)
Neutro Abs: 3 10*3/uL (ref 1.7–7.7)
Neutrophils Relative %: 53 % (ref 43–77)
Platelets: 167 10*3/uL (ref 150–400)
RBC: 4.77 MIL/uL (ref 4.22–5.81)
RDW: 12.4 % (ref 11.5–15.5)
WBC: 5.6 10*3/uL (ref 4.0–10.5)

## 2014-05-19 LAB — PROTIME-INR
INR: 1.1 (ref 0.00–1.49)
PROTHROMBIN TIME: 14.4 s (ref 11.6–15.2)

## 2014-05-19 LAB — SURGICAL PCR SCREEN
MRSA, PCR: NEGATIVE
Staphylococcus aureus: NEGATIVE

## 2014-05-19 LAB — APTT: APTT: 31 s (ref 24–37)

## 2014-05-19 LAB — TYPE AND SCREEN
ABO/RH(D): A POS
Antibody Screen: NEGATIVE

## 2014-05-19 LAB — ABO/RH: ABO/RH(D): A POS

## 2014-05-19 MED ORDER — POVIDONE-IODINE 7.5 % EX SOLN
Freq: Once | CUTANEOUS | Status: DC
  Filled 2014-05-19: qty 118

## 2014-05-19 MED ORDER — CEFAZOLIN SODIUM-DEXTROSE 2-3 GM-% IV SOLR
2.0000 g | INTRAVENOUS | Status: AC
  Administered 2014-05-20 (×2): 2 g via INTRAVENOUS

## 2014-05-19 NOTE — H&P (Signed)
     PREOPERATIVE H&P  Chief Complaint: severe left leg pain  HPI: Todd Santana is a 44 y.o. male who presents with ongoing pain in the left leg. 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 Lumbago 04/01/2009   Past Surgical History  Procedure Laterality Date  . Spine surgery      ruptured disk 2002  . Broken leg  2000  . Back surgery      x2 total     History   Social History  . Marital Status: Married    Spouse Name: N/A  . Number of Children: N/A  . Years of Education: N/A   Social History Main Topics  . Smoking status: Former Research scientist (life sciences)  . Smokeless tobacco: Not on file  . Alcohol Use: Yes     Comment: occ  . Drug Use: No  . Sexual Activity: Not on file   Other Topics Concern  . Not on file   Social History Narrative   Family History  Problem Relation Age of Onset  . Cancer Other     grandmother, breast CA   No Known Allergies Prior to Admission medications   Medication Sig Start Date End Date Taking? Authorizing Provider  oxyCODONE-acetaminophen (PERCOCET/ROXICET) 5-325 MG per tablet Take 1-2 tablets by mouth every 6 (six) hours as needed (pain).  05/07/14  Yes Historical Provider, MD     All other systems have been reviewed and were otherwise negative with the exception of those mentioned in the HPI and as above.  Physical Exam: There were no vitals filed for this visit.  General: Alert, no acute distress Cardiovascular: No pedal edema Respiratory: No cyanosis, no use of accessory musculature Skin: No lesions in the area of chief complaint Neurologic: Sensation intact distally Psychiatric: Patient is competent for consent with normal mood and affect Lymphatic: No  axillary or cervical lymphadenopathy  MUSCULOSKELETAL: + SLR on left  Assessment/Plan: Left leg pain Plan for Procedure(s): LEFT SIDED REVISION DECOMPRESSION AND TRANSFORAMINAL LUMBAR INTERBODY FUSION L5/S1   Sinclair Ship, MD 05/19/2014 7:57 PM

## 2014-05-20 ENCOUNTER — Inpatient Hospital Stay (HOSPITAL_COMMUNITY): Payer: BLUE CROSS/BLUE SHIELD | Admitting: Anesthesiology

## 2014-05-20 ENCOUNTER — Inpatient Hospital Stay (HOSPITAL_COMMUNITY): Payer: BLUE CROSS/BLUE SHIELD

## 2014-05-20 ENCOUNTER — Inpatient Hospital Stay (HOSPITAL_COMMUNITY)
Admission: RE | Admit: 2014-05-20 | Discharge: 2014-05-22 | DRG: 460 | Disposition: A | Discharge status: Home / Self Care | Payer: BLUE CROSS/BLUE SHIELD | Source: Ambulatory Visit | Attending: Orthopedic Surgery | Admitting: Orthopedic Surgery

## 2014-05-20 ENCOUNTER — Encounter (HOSPITAL_COMMUNITY)
Admission: RE | Disposition: A | Discharge status: Home / Self Care | Payer: BLUE CROSS/BLUE SHIELD | Source: Ambulatory Visit | Attending: Orthopedic Surgery

## 2014-05-20 ENCOUNTER — Encounter (HOSPITAL_COMMUNITY): Payer: Self-pay | Admitting: Anesthesiology

## 2014-05-20 DIAGNOSIS — M79605 Pain in left leg: Secondary | ICD-10-CM | POA: Diagnosis present

## 2014-05-20 DIAGNOSIS — M5410 Radiculopathy, site unspecified: Secondary | ICD-10-CM | POA: Diagnosis present

## 2014-05-20 DIAGNOSIS — Z419 Encounter for procedure for purposes other than remedying health state, unspecified: Secondary | ICD-10-CM

## 2014-05-20 DIAGNOSIS — Z87891 Personal history of nicotine dependence: Secondary | ICD-10-CM | POA: Diagnosis not present

## 2014-05-20 DIAGNOSIS — M2460 Ankylosis, unspecified joint: Secondary | ICD-10-CM

## 2014-05-20 DIAGNOSIS — M5117 Intervertebral disc disorders with radiculopathy, lumbosacral region: Principal | ICD-10-CM | POA: Diagnosis present

## 2014-05-20 SURGERY — POSTERIOR LUMBAR FUSION 1 LEVEL
Anesthesia: General | Laterality: Left

## 2014-05-20 MED ORDER — GLYCOPYRROLATE 0.2 MG/ML IJ SOLN
INTRAMUSCULAR | Status: AC
  Filled 2014-05-20: qty 3

## 2014-05-20 MED ORDER — MIDAZOLAM HCL 2 MG/2ML IJ SOLN
INTRAMUSCULAR | Status: AC
  Filled 2014-05-20: qty 2

## 2014-05-20 MED ORDER — ROCURONIUM BROMIDE 50 MG/5ML IV SOLN
INTRAVENOUS | Status: AC
  Filled 2014-05-20: qty 1

## 2014-05-20 MED ORDER — ACETAMINOPHEN 325 MG PO TABS
650.0000 mg | ORAL_TABLET | ORAL | Status: DC | PRN
  Administered 2014-05-21: 650 mg via ORAL
  Filled 2014-05-20: qty 2

## 2014-05-20 MED ORDER — FENTANYL CITRATE (PF) 250 MCG/5ML IJ SOLN
INTRAMUSCULAR | Status: AC
  Filled 2014-05-20: qty 5

## 2014-05-20 MED ORDER — PHENYLEPHRINE HCL 10 MG/ML IJ SOLN
INTRAMUSCULAR | Status: DC | PRN
  Administered 2014-05-20 (×3): 40 ug via INTRAVENOUS

## 2014-05-20 MED ORDER — PROPOFOL 10 MG/ML IV BOLUS
INTRAVENOUS | Status: DC | PRN
  Administered 2014-05-20: 50 mg via INTRAVENOUS
  Administered 2014-05-20: 200 mg via INTRAVENOUS

## 2014-05-20 MED ORDER — CEFAZOLIN SODIUM 1-5 GM-% IV SOLN
1.0000 g | Freq: Three times a day (TID) | INTRAVENOUS | Status: AC
  Administered 2014-05-20 – 2014-05-21 (×2): 1 g via INTRAVENOUS
  Filled 2014-05-20 (×2): qty 50

## 2014-05-20 MED ORDER — ROCURONIUM BROMIDE 100 MG/10ML IV SOLN
INTRAVENOUS | Status: DC | PRN
  Administered 2014-05-20: 40 mg via INTRAVENOUS
  Administered 2014-05-20: 10 mg via INTRAVENOUS

## 2014-05-20 MED ORDER — CEFAZOLIN SODIUM-DEXTROSE 2-3 GM-% IV SOLR
INTRAVENOUS | Status: AC
Start: 2014-05-20 — End: 2014-05-20
  Filled 2014-05-20: qty 50

## 2014-05-20 MED ORDER — FLEET ENEMA 7-19 GM/118ML RE ENEM
1.0000 | ENEMA | Freq: Once | RECTAL | Status: AC | PRN
  Filled 2014-05-20: qty 1

## 2014-05-20 MED ORDER — ACETAMINOPHEN 10 MG/ML IV SOLN
INTRAVENOUS | Status: AC
  Filled 2014-05-20: qty 100

## 2014-05-20 MED ORDER — HYDROMORPHONE HCL 1 MG/ML IJ SOLN
1.0000 mg | INTRAMUSCULAR | Status: DC | PRN
Start: 2014-05-20 — End: 2014-05-22

## 2014-05-20 MED ORDER — EPHEDRINE SULFATE 50 MG/ML IJ SOLN
INTRAMUSCULAR | Status: AC
  Filled 2014-05-20: qty 1

## 2014-05-20 MED ORDER — SODIUM CHLORIDE 0.9 % IJ SOLN
3.0000 mL | Freq: Two times a day (BID) | INTRAMUSCULAR | Status: DC
  Administered 2014-05-20: 3 mL via INTRAVENOUS

## 2014-05-20 MED ORDER — BUPIVACAINE-EPINEPHRINE 0.25% -1:200000 IJ SOLN
INTRAMUSCULAR | Status: DC | PRN
Start: 2014-05-20 — End: 2014-05-20
  Administered 2014-05-20: 26 mL

## 2014-05-20 MED ORDER — ARTIFICIAL TEARS OP OINT
TOPICAL_OINTMENT | OPHTHALMIC | Status: DC | PRN
  Administered 2014-05-20: 1 via OPHTHALMIC

## 2014-05-20 MED ORDER — PHENYLEPHRINE 40 MCG/ML (10ML) SYRINGE FOR IV PUSH (FOR BLOOD PRESSURE SUPPORT)
PREFILLED_SYRINGE | INTRAVENOUS | Status: AC
  Filled 2014-05-20: qty 10

## 2014-05-20 MED ORDER — LIDOCAINE HCL (CARDIAC) 20 MG/ML IV SOLN
INTRAVENOUS | Status: AC
  Filled 2014-05-20: qty 5

## 2014-05-20 MED ORDER — HYDROMORPHONE HCL 1 MG/ML IJ SOLN
INTRAMUSCULAR | Status: AC
  Filled 2014-05-20: qty 1

## 2014-05-20 MED ORDER — BISACODYL 5 MG PO TBEC
5.0000 mg | DELAYED_RELEASE_TABLET | Freq: Every day | ORAL | Status: DC | PRN
  Filled 2014-05-20: qty 1

## 2014-05-20 MED ORDER — OXYCODONE HCL 5 MG PO TABS: 5.0000 mg | ORAL_TABLET | Freq: Once | ORAL | Status: DC | PRN

## 2014-05-20 MED ORDER — DIAZEPAM 5 MG PO TABS
5.0000 mg | ORAL_TABLET | Freq: Four times a day (QID) | ORAL | Status: DC | PRN
  Administered 2014-05-20 (×2): 5 mg via ORAL
  Filled 2014-05-20: qty 1

## 2014-05-20 MED ORDER — PROPOFOL 10 MG/ML IV BOLUS
INTRAVENOUS | Status: AC
  Filled 2014-05-20: qty 20

## 2014-05-20 MED ORDER — SODIUM CHLORIDE 0.9 % IV SOLN: INTRAVENOUS | Status: DC

## 2014-05-20 MED ORDER — METHYLENE BLUE 1 % INJ SOLN
INTRAMUSCULAR | Status: AC
  Filled 2014-05-20: qty 10

## 2014-05-20 MED ORDER — ALUM & MAG HYDROXIDE-SIMETH 200-200-20 MG/5ML PO SUSP
30.0000 mL | Freq: Four times a day (QID) | ORAL | Status: DC | PRN
Start: 2014-05-20 — End: 2014-05-22

## 2014-05-20 MED ORDER — OXYCODONE HCL 5 MG/5ML PO SOLN: 5.0000 mg | Freq: Once | ORAL | Status: DC | PRN

## 2014-05-20 MED ORDER — THROMBIN 20000 UNITS EX SOLR
CUTANEOUS | Status: AC
  Filled 2014-05-20: qty 20000

## 2014-05-20 MED ORDER — LACTATED RINGERS IV SOLN
INTRAVENOUS | Status: DC | PRN
  Administered 2014-05-20 (×3): via INTRAVENOUS

## 2014-05-20 MED ORDER — OXYCODONE-ACETAMINOPHEN 5-325 MG PO TABS
1.0000 | ORAL_TABLET | ORAL | Status: DC | PRN
  Administered 2014-05-20 – 2014-05-21 (×3): 2 via ORAL
  Filled 2014-05-20 (×3): qty 2

## 2014-05-20 MED ORDER — 0.9 % SODIUM CHLORIDE (POUR BTL) OPTIME
TOPICAL | Status: DC | PRN
  Administered 2014-05-20 (×2): 1000 mL

## 2014-05-20 MED ORDER — GLYCOPYRROLATE 0.2 MG/ML IJ SOLN
INTRAMUSCULAR | Status: DC | PRN
  Administered 2014-05-20: 0.1 mg via INTRAVENOUS

## 2014-05-20 MED ORDER — DIAZEPAM 5 MG PO TABS
ORAL_TABLET | ORAL | Status: AC
  Filled 2014-05-20: qty 1

## 2014-05-20 MED ORDER — ACETAMINOPHEN 650 MG RE SUPP: 650.0000 mg | RECTAL | Status: DC | PRN

## 2014-05-20 MED ORDER — ARTIFICIAL TEARS OP OINT
TOPICAL_OINTMENT | OPHTHALMIC | Status: AC
  Filled 2014-05-20: qty 3.5

## 2014-05-20 MED ORDER — SCOPOLAMINE 1 MG/3DAYS TD PT72
MEDICATED_PATCH | TRANSDERMAL | Status: DC | PRN
  Administered 2014-05-20: 1 via TRANSDERMAL

## 2014-05-20 MED ORDER — ZOLPIDEM TARTRATE 5 MG PO TABS
5.0000 mg | ORAL_TABLET | Freq: Every evening | ORAL | Status: DC | PRN
Start: 2014-05-20 — End: 2014-05-22

## 2014-05-20 MED ORDER — FENTANYL CITRATE (PF) 100 MCG/2ML IJ SOLN
INTRAMUSCULAR | Status: DC | PRN
  Administered 2014-05-20 (×4): 50 ug via INTRAVENOUS
  Administered 2014-05-20: 100 ug via INTRAVENOUS
  Administered 2014-05-20 (×3): 50 ug via INTRAVENOUS

## 2014-05-20 MED ORDER — SUCCINYLCHOLINE CHLORIDE 20 MG/ML IJ SOLN
INTRAMUSCULAR | Status: AC
  Filled 2014-05-20: qty 1

## 2014-05-20 MED ORDER — PROMETHAZINE HCL 25 MG/ML IJ SOLN: 6.2500 mg | INTRAMUSCULAR | Status: DC | PRN

## 2014-05-20 MED ORDER — LIDOCAINE HCL (CARDIAC) 20 MG/ML IV SOLN
INTRAVENOUS | Status: DC | PRN
  Administered 2014-05-20: 80 mg via INTRAVENOUS

## 2014-05-20 MED ORDER — DOCUSATE SODIUM 100 MG PO CAPS
100.0000 mg | ORAL_CAPSULE | Freq: Two times a day (BID) | ORAL | Status: DC
  Administered 2014-05-20 – 2014-05-22 (×4): 100 mg via ORAL
  Filled 2014-05-20 (×4): qty 1

## 2014-05-20 MED ORDER — PHENOL 1.4 % MT LIQD: 1.0000 | OROMUCOSAL | Status: DC | PRN

## 2014-05-20 MED ORDER — SODIUM CHLORIDE 0.9 % IV SOLN
0.2000 mg | INTRAVENOUS | Status: DC | PRN
  Administered 2014-05-20: .2 mg via INTRAVENOUS

## 2014-05-20 MED ORDER — HYDROMORPHONE HCL 1 MG/ML IJ SOLN
0.2500 mg | INTRAMUSCULAR | Status: DC | PRN
  Administered 2014-05-20 (×4): 0.5 mg via INTRAVENOUS

## 2014-05-20 MED ORDER — SODIUM CHLORIDE 0.9 % IJ SOLN: 3.0000 mL | INTRAMUSCULAR | Status: DC | PRN

## 2014-05-20 MED ORDER — SCOPOLAMINE 1 MG/3DAYS TD PT72
MEDICATED_PATCH | TRANSDERMAL | Status: AC
  Filled 2014-05-20: qty 1

## 2014-05-20 MED ORDER — MIDAZOLAM HCL 5 MG/5ML IJ SOLN
INTRAMUSCULAR | Status: DC | PRN
  Administered 2014-05-20 (×2): 1 mg via INTRAVENOUS

## 2014-05-20 MED ORDER — NEOSTIGMINE METHYLSULFATE 10 MG/10ML IV SOLN
INTRAVENOUS | Status: DC | PRN
  Administered 2014-05-20: 1 mg via INTRAVENOUS

## 2014-05-20 MED ORDER — PROPOFOL INFUSION 10 MG/ML OPTIME
INTRAVENOUS | Status: DC | PRN
  Administered 2014-05-20: 50 ug/kg/min via INTRAVENOUS

## 2014-05-20 MED ORDER — SENNOSIDES-DOCUSATE SODIUM 8.6-50 MG PO TABS
1.0000 | ORAL_TABLET | Freq: Every evening | ORAL | Status: DC | PRN
  Filled 2014-05-20: qty 1

## 2014-05-20 MED ORDER — NEOSTIGMINE METHYLSULFATE 10 MG/10ML IV SOLN
INTRAVENOUS | Status: AC
  Filled 2014-05-20: qty 2

## 2014-05-20 MED ORDER — MENTHOL 3 MG MT LOZG
1.0000 | LOZENGE | OROMUCOSAL | Status: DC | PRN
  Administered 2014-05-21: 3 mg via ORAL

## 2014-05-20 MED ORDER — ONDANSETRON HCL 4 MG/2ML IJ SOLN
4.0000 mg | INTRAMUSCULAR | Status: DC | PRN
Start: 2014-05-20 — End: 2014-05-22

## 2014-05-20 MED ORDER — ACETAMINOPHEN 10 MG/ML IV SOLN
INTRAVENOUS | Status: DC | PRN
  Administered 2014-05-20: 1000 mg via INTRAVENOUS

## 2014-05-20 MED ORDER — SODIUM CHLORIDE 0.9 % IV SOLN: 250.0000 mL | INTRAVENOUS | Status: DC

## 2014-05-20 MED ORDER — SUCCINYLCHOLINE CHLORIDE 20 MG/ML IJ SOLN
INTRAMUSCULAR | Status: DC | PRN
  Administered 2014-05-20: 120 mg via INTRAVENOUS

## 2014-05-20 MED ORDER — DEXAMETHASONE SODIUM PHOSPHATE 10 MG/ML IJ SOLN
INTRAMUSCULAR | Status: AC
  Filled 2014-05-20: qty 1

## 2014-05-20 MED ORDER — SODIUM CHLORIDE 0.9 % IJ SOLN
INTRAMUSCULAR | Status: AC
  Filled 2014-05-20: qty 10

## 2014-05-20 MED ORDER — DEXAMETHASONE SODIUM PHOSPHATE 10 MG/ML IJ SOLN
INTRAMUSCULAR | Status: DC | PRN
  Administered 2014-05-20: 10 mg via INTRAVENOUS

## 2014-05-20 MED ORDER — BUPIVACAINE-EPINEPHRINE (PF) 0.25% -1:200000 IJ SOLN
INTRAMUSCULAR | Status: AC
  Filled 2014-05-20: qty 30

## 2014-05-20 MED ORDER — ONDANSETRON HCL 4 MG/2ML IJ SOLN
INTRAMUSCULAR | Status: DC | PRN
  Administered 2014-05-20: 4 mg via INTRAVENOUS

## 2014-05-20 MED ORDER — THROMBIN 20000 UNITS EX KIT
PACK | CUTANEOUS | Status: DC | PRN
  Administered 2014-05-20: 20 mL via TOPICAL

## 2014-05-20 MED ORDER — MORPHINE SULFATE 2 MG/ML IJ SOLN: 1.0000 mg | INTRAMUSCULAR | Status: DC | PRN

## 2014-05-20 MED FILL — Sodium Chloride IV Soln 0.9%: INTRAVENOUS | Qty: 1000 | Status: AC

## 2014-05-20 MED FILL — Heparin Sodium (Porcine) Inj 1000 Unit/ML: INTRAMUSCULAR | Qty: 30 | Status: AC

## 2014-05-20 SURGICAL SUPPLY — 85 items
APL SKNCLS STERI-STRIP NONHPOA (GAUZE/BANDAGES/DRESSINGS) ×1
BENZOIN TINCTURE PRP APPL 2/3 (GAUZE/BANDAGES/DRESSINGS) ×3 IMPLANT
BLADE SURG ROTATE 9660 (MISCELLANEOUS) IMPLANT
BUR PRESCISION 1.7 ELITE (BURR) ×2 IMPLANT
BUR ROUND PRECISION 4.0 (BURR) ×2 IMPLANT
BUR ROUND PRECISION 4.0MM (BURR) ×1
CAGE BULLET CONCORDE 9X9X27 (Cage) ×1 IMPLANT
CAGE BULLET CONCORDE 9X9X27MM (Cage) ×1 IMPLANT
CARTRIDGE OIL MAESTRO DRILL (MISCELLANEOUS) ×1 IMPLANT
CLOSURE STERI-STRIP 1/2X4 (GAUZE/BANDAGES/DRESSINGS) ×1
CLOSURE WOUND 1/2 X4 (GAUZE/BANDAGES/DRESSINGS)
CLSR STERI-STRIP ANTIMIC 1/2X4 (GAUZE/BANDAGES/DRESSINGS) ×1 IMPLANT
CONT SPEC STER OR (MISCELLANEOUS) ×1 IMPLANT
COVER MAYO STAND STRL (DRAPES) ×8 IMPLANT
COVER SURGICAL LIGHT HANDLE (MISCELLANEOUS) ×3 IMPLANT
DIFFUSER DRILL AIR PNEUMATIC (MISCELLANEOUS) ×3 IMPLANT
DRAIN CHANNEL 15F RND FF W/TCR (WOUND CARE) IMPLANT
DRAPE C-ARM 42X72 X-RAY (DRAPES) ×3 IMPLANT
DRAPE C-ARMOR (DRAPES) ×3 IMPLANT
DRAPE POUCH INSTRU U-SHP 10X18 (DRAPES) ×3 IMPLANT
DRAPE SURG 17X23 STRL (DRAPES) ×12 IMPLANT
DURAPREP 26ML APPLICATOR (WOUND CARE) ×3 IMPLANT
ELECT BLADE 4.0 EZ CLEAN MEGAD (MISCELLANEOUS) ×6
ELECT CAUTERY BLADE 6.4 (BLADE) ×3 IMPLANT
ELECT REM PT RETURN 9FT ADLT (ELECTROSURGICAL) ×3
ELECTRODE BLDE 4.0 EZ CLN MEGD (MISCELLANEOUS) ×1 IMPLANT
ELECTRODE REM PT RTRN 9FT ADLT (ELECTROSURGICAL) ×1 IMPLANT
EVACUATOR SILICONE 100CC (DRAIN) IMPLANT
GAUZE SPONGE 4X4 12PLY STRL (GAUZE/BANDAGES/DRESSINGS) ×3 IMPLANT
GAUZE SPONGE 4X4 16PLY XRAY LF (GAUZE/BANDAGES/DRESSINGS) ×6 IMPLANT
GLOVE BIO SURGEON STRL SZ7 (GLOVE) ×3 IMPLANT
GLOVE BIO SURGEON STRL SZ8 (GLOVE) ×3 IMPLANT
GLOVE BIOGEL PI IND STRL 7.0 (GLOVE) ×1 IMPLANT
GLOVE BIOGEL PI IND STRL 8 (GLOVE) ×1 IMPLANT
GLOVE BIOGEL PI INDICATOR 7.0 (GLOVE) ×2
GLOVE BIOGEL PI INDICATOR 8 (GLOVE) ×2
GOWN STRL REUS W/ TWL LRG LVL3 (GOWN DISPOSABLE) ×2 IMPLANT
GOWN STRL REUS W/ TWL XL LVL3 (GOWN DISPOSABLE) ×1 IMPLANT
GOWN STRL REUS W/TWL LRG LVL3 (GOWN DISPOSABLE) ×6
GOWN STRL REUS W/TWL XL LVL3 (GOWN DISPOSABLE) ×3
IV CATH 14GX2 1/4 (CATHETERS) ×3 IMPLANT
KIT BASIN OR (CUSTOM PROCEDURE TRAY) ×3 IMPLANT
KIT POSITION SURG JACKSON T1 (MISCELLANEOUS) ×3 IMPLANT
KIT ROOM TURNOVER OR (KITS) ×3 IMPLANT
MARKER SKIN DUAL TIP RULER LAB (MISCELLANEOUS) ×3 IMPLANT
MILL MEDIUM DISP (BLADE) ×4 IMPLANT
MIX DBX 10CC 35% BONE (Bone Implant) ×2 IMPLANT
NDL HYPO 25GX1X1/2 BEV (NEEDLE) ×1 IMPLANT
NDL SAFETY ECLIPSE 18X1.5 (NEEDLE) ×1 IMPLANT
NDL SPNL 18GX3.5 QUINCKE PK (NEEDLE) ×2 IMPLANT
NEEDLE 22X1 1/2 (OR ONLY) (NEEDLE) ×3 IMPLANT
NEEDLE BONE MARROW 8GX6 FENEST (NEEDLE) IMPLANT
NEEDLE HYPO 18GX1.5 SHARP (NEEDLE) ×3
NEEDLE HYPO 25GX1X1/2 BEV (NEEDLE) ×3 IMPLANT
NEEDLE SPNL 18GX3.5 QUINCKE PK (NEEDLE) ×6 IMPLANT
NS IRRIG 1000ML POUR BTL (IV SOLUTION) ×5 IMPLANT
OIL CARTRIDGE MAESTRO DRILL (MISCELLANEOUS) ×3
PACK LAMINECTOMY ORTHO (CUSTOM PROCEDURE TRAY) ×3 IMPLANT
PACK UNIVERSAL I (CUSTOM PROCEDURE TRAY) ×3 IMPLANT
PAD ARMBOARD 7.5X6 YLW CONV (MISCELLANEOUS) ×6 IMPLANT
PATTIES SURGICAL .5 X1 (DISPOSABLE) ×3 IMPLANT
PATTIES SURGICAL .5X1.5 (GAUZE/BANDAGES/DRESSINGS) ×1 IMPLANT
ROD PRE BENT EXP 40MM (Rod) ×4 IMPLANT
SCREW EXPEDIUM POLYAXIAL 7X40M (Screw) ×4 IMPLANT
SCREW POLYAXIAL 7X45MM (Screw) ×4 IMPLANT
SCREW SET SINGLE INNER (Screw) ×8 IMPLANT
SPONGE INTESTINAL PEANUT (DISPOSABLE) ×3 IMPLANT
SPONGE SURGIFOAM ABS GEL 100 (HEMOSTASIS) ×3 IMPLANT
STRIP CLOSURE SKIN 1/2X4 (GAUZE/BANDAGES/DRESSINGS) ×2 IMPLANT
SURGIFLO TRUKIT (HEMOSTASIS) IMPLANT
SUT MNCRL AB 4-0 PS2 18 (SUTURE) ×4 IMPLANT
SUT VIC AB 0 CT1 18XCR BRD 8 (SUTURE) ×1 IMPLANT
SUT VIC AB 0 CT1 8-18 (SUTURE) ×3
SUT VIC AB 1 CT1 18XCR BRD 8 (SUTURE) ×2 IMPLANT
SUT VIC AB 1 CT1 8-18 (SUTURE) ×3
SUT VIC AB 2-0 CT2 18 VCP726D (SUTURE) ×3 IMPLANT
SYR 20CC LL (SYRINGE) ×3 IMPLANT
SYR BULB IRRIGATION 50ML (SYRINGE) ×3 IMPLANT
SYR CONTROL 10ML LL (SYRINGE) ×6 IMPLANT
SYR TB 1ML LUER SLIP (SYRINGE) ×3 IMPLANT
TOWEL OR 17X24 6PK STRL BLUE (TOWEL DISPOSABLE) ×3 IMPLANT
TOWEL OR 17X26 10 PK STRL BLUE (TOWEL DISPOSABLE) ×3 IMPLANT
TRAY FOLEY CATH 16FRSI W/METER (SET/KITS/TRAYS/PACK) ×3 IMPLANT
WATER STERILE IRR 1000ML POUR (IV SOLUTION) ×1 IMPLANT
YANKAUER SUCT BULB TIP NO VENT (SUCTIONS) ×3 IMPLANT

## 2014-05-20 NOTE — Anesthesia Preprocedure Evaluation (Addendum)
Anesthesia Evaluation  Patient identified by MRN, date of birth, ID band Patient awake    Reviewed: Allergy & Precautions, NPO status , Patient's Chart, lab work & pertinent test results  Airway Mallampati: I       Dental  (+) Teeth Intact   Pulmonary former smoker,  breath sounds clear to auscultation        Cardiovascular negative cardio ROS  Rhythm:Regular Rate:Normal     Neuro/Psych negative neurological ROS     GI/Hepatic negative GI ROS, Neg liver ROS,   Endo/Other  negative endocrine ROS  Renal/GU negative Renal ROS     Musculoskeletal   Abdominal   Peds  Hematology negative hematology ROS (+)   Anesthesia Other Findings   Reproductive/Obstetrics                            Anesthesia Physical Anesthesia Plan  ASA: I  Anesthesia Plan: General   Post-op Pain Management:    Induction: Intravenous  Airway Management Planned: Oral ETT  Additional Equipment:   Intra-op Plan:   Post-operative Plan: Extubation in OR  Informed Consent: I have reviewed the patients History and Physical, chart, labs and discussed the procedure including the risks, benefits and alternatives for the proposed anesthesia with the patient or authorized representative who has indicated his/her understanding and acceptance.   Dental advisory given  Plan Discussed with: CRNA and Surgeon  Anesthesia Plan Comments:         Anesthesia Quick Evaluation

## 2014-05-20 NOTE — Anesthesia Procedure Notes (Signed)
Procedure Name: Intubation Date/Time: 05/20/2014 8:38 AM Performed by: Susa Loffler Pre-anesthesia Checklist: Patient identified, Timeout performed, Emergency Drugs available, Suction available and Patient being monitored Patient Re-evaluated:Patient Re-evaluated prior to inductionOxygen Delivery Method: Circle system utilized Preoxygenation: Pre-oxygenation with 100% oxygen Intubation Type: IV induction Ventilation: Mask ventilation without difficulty Laryngoscope Size: Mac and 4 Grade View: Grade I Tube type: Oral Tube size: 7.5 mm Number of attempts: 1 Airway Equipment and Method: Stylet Placement Confirmation: ETT inserted through vocal cords under direct vision,  positive ETCO2 and breath sounds checked- equal and bilateral Secured at: 23 cm Tube secured with: Tape Dental Injury: Teeth and Oropharynx as per pre-operative assessment

## 2014-05-20 NOTE — Transfer of Care (Signed)
Immediate Anesthesia Transfer of Care Note  Patient: Todd Santana  Procedure(s) Performed: Procedure(s) with comments: LEFT SIDED REVISION TRANSFORAMINAL LUMBAR INTERBODY FUSION LEVEL 1 (Left) - Left sided revision lumbar 5-sacrum 1 Transforaminal lumbar interbody fusion with instrumentation and allograft  Patient Location: PACU  Anesthesia Type:General  Level of Consciousness: awake, alert  and oriented  Airway & Oxygen Therapy: Patient Spontanous Breathing and Patient connected to nasal cannula oxygen  Post-op Assessment: Report given to RN and Post -op Vital signs reviewed and stable  Post vital signs: Reviewed and stable  Last Vitals:  Filed Vitals:   05/20/14 0657  BP: 121/81  Pulse: 72  Temp: 36.4 C  Resp: 20    Complications: No apparent anesthesia complications

## 2014-05-20 NOTE — Progress Notes (Signed)
Utilization review completed.  

## 2014-05-21 MED ORDER — OXYCODONE HCL ER 10 MG PO T12A
10.0000 mg | EXTENDED_RELEASE_TABLET | Freq: Two times a day (BID) | ORAL | Status: DC
  Administered 2014-05-21 (×2): 10 mg via ORAL
  Filled 2014-05-21 (×2): qty 1

## 2014-05-21 MED ORDER — METHOCARBAMOL 500 MG PO TABS
750.0000 mg | ORAL_TABLET | Freq: Four times a day (QID) | ORAL | Status: DC | PRN
  Administered 2014-05-21: 750 mg via ORAL
  Filled 2014-05-21: qty 2

## 2014-05-21 MED ORDER — PROMETHAZINE HCL 25 MG PO TABS
25.0000 mg | ORAL_TABLET | ORAL | Status: DC | PRN
  Administered 2014-05-21 – 2014-05-22 (×5): 25 mg via ORAL
  Filled 2014-05-21 (×6): qty 1

## 2014-05-21 MED ORDER — HYDROMORPHONE HCL 2 MG PO TABS
2.0000 mg | ORAL_TABLET | ORAL | Status: DC | PRN
  Administered 2014-05-21 (×2): 4 mg via ORAL
  Administered 2014-05-21: 2 mg via ORAL
  Administered 2014-05-22 (×2): 4 mg via ORAL
  Filled 2014-05-21: qty 1
  Filled 2014-05-21 (×4): qty 2

## 2014-05-21 MED ORDER — DIAZEPAM 5 MG PO TABS
5.0000 mg | ORAL_TABLET | Freq: Four times a day (QID) | ORAL | Status: DC | PRN
  Administered 2014-05-21: 5 mg via ORAL
  Filled 2014-05-21: qty 1

## 2014-05-21 MED FILL — Thrombin For Soln 20000 Unit: CUTANEOUS | Qty: 1 | Status: AC

## 2014-05-21 NOTE — Evaluation (Signed)
Physical Therapy Evaluation Patient Details Name: Todd Santana MRN: [id number] DOB: Jun 20, 1970 Today's Date: 05/21/2014   History of Present Illness  Pt is a 44 y.o. Male s/p posterior lumbar fusion 1 level on 05/20/14 for left leg pain.   Clinical Impression  Pt presents to PT with decr mobility due to pain and weakness. Needs skilled PT to maximize I and safety so pt can return home.     Follow Up Recommendations No PT follow up    Equipment Recommendations  Rolling walker with 5" wheels (possibly)    Recommendations for Other Services       Precautions / Restrictions Precautions Precautions: Back Precaution Booklet Issued: Yes (comment) Required Braces or Orthoses: Spinal Brace Spinal Brace: Thoracolumbosacral orthotic;Applied in sitting position;Applied in standing position;Other (comment) (for 6 weeks; off with nighttime bathroom trips per order)      Mobility  Bed Mobility Overal bed mobility: Needs Assistance Bed Mobility: Sidelying to Sit;Rolling;Sit to Sidelying Rolling: Supervision Sidelying to sit: Supervision     Sit to sidelying: Supervision General bed mobility comments: Incr time and effort and use of rail.  Transfers Overall transfer level: Needs assistance Equipment used: Rolling walker (2 wheeled) Transfers: Sit to/from Stand Sit to Stand: Min guard         General transfer comment: Min guard to stand due to weakness and pain. Pt required RW to push up from EOB.   Ambulation/Gait Ambulation/Gait assistance: Min guard Ambulation Distance (Feet): 200 Feet Assistive device: Rolling walker (2 wheeled) Gait Pattern/deviations: Step-through pattern;Decreased step length - right;Decreased step length - left   Gait velocity interpretation: Below normal speed for age/gender General Gait Details: Pt using hip hike or circumduction on lt due to hip extension brace. Heavy reliance on walker.  Stairs            Wheelchair Mobility     Modified Rankin (Stroke Patients Only)       Balance Overall balance assessment: No apparent balance deficits (not formally assessed)                                           Pertinent Vitals/Pain Pain Assessment: 0-10 Pain Score: 10-Worst pain ever (10 with activity, 3 at rest) Pain Location: back with activity. Pain Descriptors / Indicators: Aching;Operative site guarding Pain Intervention(s): Limited activity within patient's tolerance;Premedicated before session;Repositioned    Home Living Family/patient expects to be discharged to:: Private residence Living Arrangements: Spouse/significant other Available Help at Discharge: Family;Available 24 hours/day Type of Home: House Home Access: Stairs to enter   [employer] of Steps: 2-3 Home Layout: Two level;Able to live on main level with bedroom/bathroom Home Equipment: Shower seat      Prior Function Level of Independence: Independent               Hand Dominance        Extremity/Trunk Assessment   Upper Extremity Assessment: Defer to OT evaluation           Lower Extremity Assessment: Generalized weakness         Communication   Communication: No difficulties  Cognition Arousal/Alertness: Awake/alert Behavior During Therapy: WFL for tasks assessed/performed Overall Cognitive Status: Within Functional Limits for tasks assessed                      General Comments      Exercises  Assessment/Plan    PT Assessment Patient needs continued PT services  PT Diagnosis Difficulty walking;Acute pain;Generalized weakness   PT Problem List Decreased strength;Decreased activity tolerance;Decreased mobility  PT Treatment Interventions DME instruction;Gait training;Stair training;Functional mobility training;Therapeutic activities;Patient/family education   PT Goals (Current goals can be found in the Care Plan section) Acute Rehab PT Goals Patient Stated  Goal: to get back to independence PT Goal Formulation: With patient Time For Goal Achievement: 05/28/14 Potential to Achieve Goals: Good    Frequency Min 5X/week   Barriers to discharge        Co-evaluation               End of Session Equipment Utilized During Treatment: Back brace Activity Tolerance: Patient limited by pain Patient left: in bed;with call bell/phone within reach;with family/visitor present Nurse Communication: Mobility status         Time: 5035-4656 PT Time Calculation (min) (ACUTE ONLY): 14 min   Charges:   PT Evaluation $Initial PT Evaluation Tier I: 1 Procedure     PT G Codes:        Savina Olshefski May 24, 2014, 1:49 PM  [facility] PT [phone]

## 2014-05-21 NOTE — Op Note (Signed)
Todd Santana, Todd Santana NO.:  [id number]  MEDICAL RECORD NO.:  [id number]  LOCATION:                                FACILITY:  [facility]  PHYSICIAN:  Phylliss Bob, MD      DATE OF BIRTH:  1970-05-27  DATE OF PROCEDURE:  05/20/2014                              OPERATIVE REPORT   PREOPERATIVE DIAGNOSES: 1. Left S1 radiculopathy. 2. 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 Left S1 radiculopathy. 2. 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 Revision decompression, L5-S1 with removal of large complex L5-S1     disc herniation. 2. Left-sided L5-S1 transforaminal lumbar interbody fusion. 3. Right-sided L5-S1 posterolateral fusion. 4. Insertion of interbody device x1. 5. Placement of posterior instrumentation (7 mm pedicle screws). 6. Use of local autograft. 7. Use of morselized allograft. 8. Intraoperative use of fluoroscopy.  SURGEON:  Phylliss Bob, MD  ASSISTANT:  Pricilla Holm, PA-C.  ANESTHESIA:  General endotracheal anesthesia.  COMPLICATIONS:  None.  DISPOSITION:  Stable.  ESTIMATED BLOOD LOSS:  100 mL.  INDICATIONS FOR SURGERY:  Briefly, Todd Santana is a 44 year old male, who did present to me with severe and debilitating pain in his left leg.  The patient has essentially been debilitated and bed bound for the last couple weeks.  Of particular note, the patient is status post 2 previous L5-S1 decompressions by another surgeon.  He did well from those surgeries, however, did have severe and debilitating pain. He did fail multiple forms of conservative care, including an attempt at physical therapy, and injections.  He did elect to proceed with the procedure noted above.  OPERATIVE DETAILS:  On May 20, 2014, the patient was brought to surgery and general endotracheal anesthesia was administered.  The patient was placed prone on a well-padded flat  Jackson bed with a spinal frame.  Antibiotics were given.  The back was prepped and draped and a time-out procedure was performed.  I then reutilized the patient's previous incision, which was extended more cephalad.  The fascia was incised in the midline and the paraspinal musculature was retracted laterally.  The entry points of the L5 and S1 pedicles were identified using anatomic landmarks.  I then used AP and lateral fluoroscopy to cannulate the L5 and S1 pedicles in the usual fashion.  I did use a curved gearshift probe, followed by a 6 mm tap.  I did use a ball-tip probe to confirm there was no cortical violation of the cannulated pedicles.  Then, on the patient's right side, I did decorticate the L5- S1 facet joint and the posterior elements.  A 7 x 45 mm screw was placed on the right at L5 and a 7 x 40 mm screw was placed on the right at S1. A 40 mm rod was then placed and distraction was applied across the rod and caps were placed and provisionally tightened.  I placed bone wax in the cannulated left pedicle screws and pedicle holes.  I then performed a full facetectomy on the left.  The traversing S1 nerve was identified and noted to be under substantial tension.  A very large  disc herniation was readily noted to be medial and ventral to the traversing S1 nerve on the left side.  I did meticulously develop a plane between the traversing S1 nerve and the herniated disc fragment.  In doing so, I was able to remove multiple large fragments and I was able to decompress the traversing S1 nerve  completely in this manner.  I then used a 15-blade knife to perform an annulotomy at the posterolateral aspect of the left side of the L5-S1 intervertebral disc.  I then used a series of curettes and pituitary rongeurs to perform a thorough and complete intervertebral diskectomy.  The endplates were appropriately prepared.  I then placed a series of intervertebral spacer trials and I did feel  that a 9 mm trial spacer would be the most appropriate fit.  The intervertebral space was then liberally packed with allograft and autograft, as was the appropriate-sized intervertebral spacer.  The spacer was then tamped into position in the usual fashion.  I was very pleased with the press fit of the spacer.  The wound was copiously irrigated with approximately 2 L normal saline throughout the surgery.  I then explored the wound for any undue bleeding and there was none encountered.  There was no extravasation of cerebrospinal fluid noted.  I then rechecked the S1 nerve, and it was clearly noted to be free of any tension or compression.  I was very pleased with the final decompression.  Then, on the right side, I placed pedicle screws at L5 and S1 as previously noted.  A 40 mm rod was placed and caps were then placed and a final locking procedure was performed.  I was very pleased with the final AP and lateral fluoroscopic images.  The wound was then irrigated.  The wound was then closed in layers using #1 Vicryl, followed by 2-0 Vicryl, followed by 3-0 Monocryl.  Benzoin and Steri-Strips were applied and followed by sterile dressing.  All instrument counts were correct at the termination of the procedure.  Of note, Pricilla Holm was my assistant throughout surgery, and did aid in retraction, suctioning, and closure.     Phylliss Bob, MD     MD/MEDQ  D:  05/20/2014  T:  05/20/2014  Job:  [id number]

## 2014-05-21 NOTE — Progress Notes (Signed)
    Patient doing well Left leg pain is resolved   Physical Exam: Filed Vitals:   05/21/14 0400  BP: 106/68  Pulse: 78  Temp: 98.7 F (37.1 C)  Resp: 18   Patient looks well Dressing in place NVI  POD #1 s/p revision L5/S1 decompression and L5/S1 fusion  - up with PT/OT, encourage ambulation - Percocet for pain, Robaxin for muscle spasms - likely d/c home tomorrow morning

## 2014-05-21 NOTE — Evaluation (Signed)
Occupational Therapy Evaluation Patient Details Name: Todd Santana MRN: [id number] DOB: 03-24-1970 Today's Date: 05/21/2014    History of Present Illness Pt is a 44 y.o. Male s/p posterior lumbar fusion 1 level on 05/20/14 for left leg pain.    Clinical Impression   PTA pt lived at home and was independent with ADLs. Pt is currently limited by pain, LE weakness, and decreased ROM and requires assist for LB ADLs and functional mobility. Pt will benefit from acute OT to promote independence and progress pt to Supervision level to return home.     Follow Up Recommendations  No OT follow up;Supervision - Intermittent    Equipment Recommendations  None recommended by OT    Recommendations for Other Services       Precautions / Restrictions Precautions Precautions: Back Precaution Booklet Issued: Yes (comment) Precaution Comments: Educated pt and wife on 3/3 back precautions and incorporating into ADLs.  Required Braces or Orthoses: Spinal Brace Spinal Brace: Thoracolumbosacral orthotic;Applied in sitting position;Applied in standing position;Other (comment) (for 6 weeks; off with nighttime bathroom trips per order) Restrictions Weight Bearing Restrictions: No      Mobility Bed Mobility Overal bed mobility: Needs Assistance Bed Mobility: Rolling;Sit to Sidelying Rolling: Supervision       Sit to sidelying: Supervision General bed mobility comments: Pt feeling nauseous after toileting and returned to bed using sit>supine with HOB elevated and use of arm rails, but corrected pt and verbalized log roll technique. Pt and wife verbalize understanding.   Transfers Overall transfer level: Needs assistance Equipment used: Rolling walker (2 wheeled) Transfers: Sit to/from Stand Sit to Stand: Min guard         General transfer comment: Min guard to stand due to weakness and pain. Pt required RW to push up from EOB.          ADL Overall ADL's : Needs  assistance/impaired Eating/Feeding: Independent;Sitting   Grooming: Min guard;Standing   Upper Body Bathing: Set up;Sitting   Lower Body Bathing: Minimal assistance;Sit to/from stand   Upper Body Dressing : Set up;Sitting   Lower Body Dressing: Minimal assistance;Sit to/from stand   Toilet Transfer: Min guard;Ambulation;RW Toilet Transfer Details (indicate cue type and reason): pt stood at toilet with min guard for safety due to c/o LE weakness Toileting- Clothing Manipulation and Hygiene: Min guard;Sit to/from stand       Functional mobility during ADLs: Min guard;Rolling walker General ADL Comments: Pt was limited by pain and LE weakness. He reports walking in hallways yesterday and is disappointed with current ability due to pain level. Pt had difficulty standing from EOB and reports that his legs "get to a point where I feel I may collapse."     Vision Additional Comments: No change from baseline          Pertinent Vitals/Pain Pain Assessment: 0-10 Pain Score: 7  Pain Location: back, surgical Pain Descriptors / Indicators: Aching Pain Intervention(s): Limited activity within patient's tolerance;Monitored during session;Repositioned     Hand Dominance     Extremity/Trunk Assessment Upper Extremity Assessment Upper Extremity Assessment: Overall WFL for tasks assessed   Lower Extremity Assessment Lower Extremity Assessment: Generalized weakness;Defer to PT evaluation   Cervical / Trunk Assessment Cervical / Trunk Assessment: Normal   Communication Communication Communication: No difficulties   Cognition Arousal/Alertness: Awake/alert Behavior During Therapy: WFL for tasks assessed/performed Overall Cognitive Status: Within Functional Limits for tasks assessed  Home Living Family/patient expects to be discharged to:: Private residence Living Arrangements: Spouse/significant other Available Help at Discharge:  Family;Available 24 hours/day Type of Home: House Home Access: Stairs to enter [employer] of Steps: 2-3   Home Layout: Two level;Able to live on main level with bedroom/bathroom     Bathroom Shower/Tub: Occupational psychologist: Standard     Home Equipment: Shower seat          Prior Functioning/Environment Level of Independence: Independent             OT Diagnosis: Generalized weakness;Acute pain   OT Problem List: Decreased strength;Decreased range of motion;Decreased activity tolerance;Impaired balance (sitting and/or standing);Decreased knowledge of precautions;Decreased knowledge of use of DME or AE;Pain   OT Treatment/Interventions: Self-care/ADL training;Therapeutic exercise;Energy conservation;DME and/or AE instruction;Therapeutic activities;Patient/family education;Balance training    OT Goals(Current goals can be found in the care plan section) Acute Rehab OT Goals Patient Stated Goal: to get back to independence OT Goal Formulation: With patient Time For Goal Achievement: 06/04/14 Potential to Achieve Goals: Good ADL Goals Pt Will Perform Grooming: with supervision;standing Pt Will Perform Lower Body Bathing: with supervision;sit to/from stand Pt Will Perform Lower Body Dressing: with supervision;sit to/from stand Pt Will Transfer to Toilet: with supervision;ambulating Pt Will Perform Toileting - Clothing Manipulation and hygiene: with supervision;sit to/from stand Pt Will Perform Tub/Shower Transfer: Shower transfer;with supervision;ambulating  OT Frequency: Min 2X/week    End of Session Equipment Utilized During Treatment: Rolling walker Nurse Communication: Mobility status  Activity Tolerance: Patient limited by pain Patient left: in bed;with call bell/phone within reach;with family/visitor present   Time: 0454-0981 OT Time Calculation (min): 18 min Charges:  OT General Charges $OT Visit: 1 Procedure OT Evaluation $Initial  OT Evaluation Tier I: 1 Procedure G-Codes:    Juluis Rainier May 28, 2014, 8:35 AM  Cyndie Chime, OTR/L Occupational Therapist [phone] (pager)

## 2014-05-22 MED ORDER — PROMETHAZINE HCL 25 MG PO TABS: 25.0000 mg | ORAL_TABLET | ORAL | Status: DC | PRN

## 2014-05-22 MED ORDER — HYDROMORPHONE HCL 2 MG PO TABS: 2.0000 mg | ORAL_TABLET | ORAL | Status: DC | PRN

## 2014-05-22 NOTE — Progress Notes (Signed)
Occupational Therapy Treatment Patient Details Name: Todd Santana MRN: [id number] DOB: 02/06/1970 Today's Date: 05/22/2014    History of present illness Pt is a 44 y.o. Male s/p posterior lumbar fusion 1 level on 05/20/14 for left leg pain.    OT comments  Pt is progressing slowly but limited due to pain. He reports his pain medications "knock him out" and encouraged frequent but small ambulation at home every 45 minutes. Reinforced back precautions and education. Pt safe for d/c home from OT standpoint.    Follow Up Recommendations  No OT follow up;Supervision - Intermittent    Equipment Recommendations  None recommended by OT    Recommendations for Other Services      Precautions / Restrictions Precautions Precautions: Back Precaution Comments: Educated pt and wife on 3/3 back precautions and incorporating into ADLs.  Required Braces or Orthoses: Spinal Brace Spinal Brace: Thoracolumbosacral orthotic;Applied in sitting position;Applied in standing position;Other (comment) (for 6 weeks; off with nighttime bathroom trips per order) Restrictions Weight Bearing Restrictions: No       Mobility Bed Mobility Overal bed mobility: Needs Assistance Bed Mobility: Rolling;Sidelying to Sit Rolling: Supervision Sidelying to sit: Supervision       General bed mobility comments: HOB flat, no use of rail. Increased time. Supervision for precautions and technique  Transfers Overall transfer level: Needs assistance Equipment used: Rolling walker (2 wheeled) Transfers: Sit to/from Stand Sit to Stand: Supervision         General transfer comment: Supervision for safety.         ADL Overall ADL's : Needs assistance/impaired     Grooming: Min guard;Standing       Lower Body Bathing: Minimal assistance;Sit to/from stand   Upper Body Dressing : Set up;Sitting Upper Body Dressing Details (indicate cue type and reason): pt able to don brace sitting EOB with wife assist to  setup. Pt was able to loop straps on Right side without twisting.  Lower Body Dressing: Minimal assistance;Sit to/from stand   Toilet Transfer: Ambulation;RW;Supervision/safety         Tub/Shower Transfer Details (indicate cue type and reason): educated on shower transfer technique and sequencing with back brace. Recommended pt sit for shower due to back precautions and risk of fall Functional mobility during ADLs: Supervision/safety;Rolling walker General ADL Comments: pt ambulated to hallway at Supervision level. Encouraged frequent, small ambulation at home every 45 minutes. He would like to try a cane with PT today                Cognition  Arousal/Alertness: Awake/Alert Behavior During Therapy: WFL for tasks assessed/performed Overall Cognitive Status: Within Functional Limits for tasks assessed                                    Pertinent Vitals/ Pain       Pain Assessment: 0-10 Pain Score: 6  Pain Location: back with activity Pain Descriptors / Indicators: Aching;Operative site guarding Pain Intervention(s): Limited activity within patient's tolerance;Monitored during session;Repositioned         Frequency Min 2X/week     Progress Toward Goals  OT Goals(current goals can now be found in the care plan section)  Progress towards OT goals: Progressing toward goals  Acute Rehab OT Goals Patient Stated Goal: home today  Plan Discharge plan remains appropriate       End of Session Equipment Utilized During Treatment: Rolling walker;Back brace  Activity Tolerance Patient tolerated treatment well   Patient Left Other (comment);with call Todd/phone within reach;with family/visitor present (sitting EOB awaiting PT)   Nurse Communication          Time: 8502-7741 OT Time Calculation (min): 21 min  Charges: OT General Charges $OT Visit: 1 Procedure OT Treatments $Self Care/Home Management : 8-22 mins  Villa Herb M 05/22/2014, 11:01  AM  Cyndie Chime, OTR/L Occupational Therapist [phone] (pager)

## 2014-05-22 NOTE — Progress Notes (Signed)
Patient alert and oriented, mae's well, voiding adequate amount of urine, swallowing without difficulty,  c/o minimal pain and support given prior to discharged. Patient discharged home with spouse. Script and discharged instructions given to patient. Patient and family stated understanding of d/c instructions given and has an appointment with MD.

## 2014-05-22 NOTE — Progress Notes (Signed)
Physical Therapy Treatment Patient Details Name: Todd Santana MRN: [id number] DOB: [birth year] Today's Date: 05/22/2014    History of Present Illness Pt is a 44 y.o. Male s/p posterior lumbar fusion 1 level on 05/20/14 for left leg pain.     PT Comments    Pt was able to negotiate steps today with cane and wife HHA and step to pattern.  He is able to ambulate with RW at S/MOD I level and S/min-guard with straight cane. Discussed use of RW vs. cane with pt and wife, and continue to recommend RW at this time and family will be obtaining a cane.  Pt reports the hardest transition is from sitting to standing.  They were educated on home modifications to A with this.  Wife demonstrated knowledge of A pt with donning brace from earlier OT session.  Reviewed back precautions as well. Pt is safe to d/c from PT standpoint.   Follow Up Recommendations  No PT follow up     Equipment Recommendations  Rolling walker with 5" wheels    Recommendations for Other Services       Precautions / Restrictions Precautions Precautions: Back Precaution Comments: Educated pt and wife on 3/3 back precautions and incorporating into ADLs.  Required Braces or Orthoses: Spinal Brace Spinal Brace: Thoracolumbosacral orthotic;Applied in sitting position;Applied in standing position;Other (comment) Spinal Brace Comments: for 6 weeks; off with nighttime bathroom trips per order Restrictions Weight Bearing Restrictions: No    Mobility  Bed Mobility Overal bed mobility: Needs Assistance Bed Mobility: Supine to Sit Rolling: Supervision Sidelying to sit: Supervision       General bed mobility comments: Bed flat with no rail. S for precautions.  Wife able to assist pt in donning brace while sitting EOB.  Pt needing cues to not twist with donning. Wife and pt able to don without any other verbal cueing.  Transfers Overall transfer level: Needs assistance Equipment used: Rolling walker (2 wheeled) Transfers:  Sit to/from Stand Sit to Stand: Supervision         General transfer comment: Slow transition from sit to stand, and pr reports this is the hardest part of mobility.  Ambulation/Gait Ambulation/Gait assistance: Min guard;Supervision Ambulation Distance (Feet): 215 Feet (100 with RW with MOD I / S) Assistive device: Straight cane;Rolling walker (2 wheeled) Gait Pattern/deviations: Step-through pattern Gait velocity: decreased   General Gait Details: Ambulated 100' with RW with MOD I to S with decreased reliance on it today. Amb 215' with straight cane with Min/guard to S.   Stairs Stairs: Yes Stairs assistance: Min guard Stair Management: With cane;Forwards;Backwards;Step to pattern Number of Stairs: 2 General stair comments: Went up 2 stairs x 2 reps with cane and 1 rail, then went up with cane and wife HHA with min/guard.  Instructed in proper technique.  Pt prefers to put cane up first.  Wheelchair Mobility    Modified Rankin (Stroke Patients Only)       Balance Overall balance assessment: No apparent balance deficits (not formally assessed)                                  Cognition Arousal/Alertness: Awake/alert Behavior During Therapy: WFL for tasks assessed/performed Overall Cognitive Status: Within Functional Limits for tasks assessed                      Exercises      General Comments  Pertinent Vitals/Pain Pain Assessment: 0-10 Pain Score: 6  Pain Location: back with activity Pain Descriptors / Indicators: Aching;Operative site guarding Pain Intervention(s): Monitored during session;Limited activity within patient's tolerance    Home Living                      Prior Function            PT Goals (current goals can now be found in the care plan section) Acute Rehab PT Goals Patient Stated Goal: home PT Goal Formulation: With patient/family Time For Goal Achievement: 05/28/14 Potential to Achieve Goals:  Good Progress towards PT goals: Progressing toward goals    Frequency  Min 5X/week    PT Plan Current plan remains appropriate    Co-evaluation             End of Session Equipment Utilized During Treatment: Back brace;Gait belt Activity Tolerance: Patient tolerated treatment well Patient left: in bed;with call bell/phone within reach;with family/visitor present (sitting EOB)     Time: 1027-1050 PT Time Calculation (min) (ACUTE ONLY): 23 min  Charges:  $Gait Training: 8-22 mins $Therapeutic Activity: 8-22 mins                    G Codes:      Todd Santana 05/22/2014, 11:32 AM

## 2014-05-22 NOTE — Progress Notes (Signed)
    Patient doing well Has been dealing with low back pain and muscle spasms, denies leg pain   Physical Exam: Filed Vitals:   05/22/14 0400  BP: 113/70  Pulse: 91  Temp: 99.7 F (37.6 C)  Resp: 20   Patient ambulating well with walker, feels much steadier on feet Dressing in place NVI  POD #2 s/p L5/S1 decompression and fusion, doing well   - up with PT, encourage ambulation - Dilaudid for pain, Valium for muscle spasms, pain is better controlled today, so we will d/c oxycontin - likely d/c home later today depending on PT progress

## 2014-05-26 NOTE — Anesthesia Postprocedure Evaluation (Signed)
  Anesthesia Post-op Note  Patient: Todd Santana  Procedure(s) Performed: Procedure(s) with comments: LEFT SIDED REVISION TRANSFORAMINAL LUMBAR INTERBODY FUSION LEVEL 1 (Left) - Left sided revision lumbar 5-sacrum 1 Transforaminal lumbar interbody fusion with instrumentation and allograft  Patient Location: PACU  Anesthesia Type:General  Level of Consciousness: awake and alert   Airway and Oxygen Therapy: Patient Spontanous Breathing  Post-op Pain: mild  Post-op Assessment: Post-op Vital signs reviewed  Post-op Vital Signs: stable  Last Vitals:  Filed Vitals:   05/22/14 1216  BP: 130/82  Pulse: 94  Temp: 36.9 C  Resp: 18    Complications: No apparent anesthesia complications

## 2014-06-10 NOTE — Discharge Summary (Signed)
Patient ID: Todd Santana MRN: [id number] DOB/AGE: 04-02-1970 45 y.o.  Admit date: 05/20/2014 Discharge date: 05/22/2014  Admission Diagnoses:  Active Problems:   Radiculopathy   Discharge Diagnoses:  Same  Past Medical History  Diagnosis Date  . Lumbago 04/01/2009    Surgeries: Procedure(s): LEFT SIDED REVISION TRANSFORAMINAL LUMBAR INTERBODY FUSION LEVEL 1 L5-S1 on 05/20/2014   Consultants:  None  Discharged Condition: Improved  Hospital Course: Todd Santana is an 44 y.o. male who was admitted 05/20/2014 for operative treatment of radiculopathy. Patient has severe unremitting pain that affects sleep, daily activities, and work/hobbies. After pre-op clearance the patient was taken to the operating room on 05/20/2014 and underwent  Procedure(s): LEFT SIDED REVISION TRANSFORAMINAL LUMBAR INTERBODY FUSION LEVEL 1 L5-S1.    Patient was given perioperative antibiotics:  Anti-infectives    Start     Dose/Rate Route Frequency Ordered Stop   05/20/14 2030  ceFAZolin (ANCEF) IVPB 1 g/50 mL premix     1 g 100 mL/hr over 30 Minutes Intravenous Every 8 hours 05/20/14 1526 05/21/14 0418   05/20/14 0600  ceFAZolin (ANCEF) IVPB 2 g/50 mL premix     2 g 100 mL/hr over 30 Minutes Intravenous On call to O.R. 05/19/14 1338 05/20/14 1230       Patient was given sequential compression devices, early ambulation to prevent DVT.  Patient benefited maximally from hospital stay and there were no complications.    Recent vital signs: BP 130/82 mmHg  Pulse 94  Temp(Src) 98.4 F (36.9 C) (Oral)  Resp 18  Wt 76.658 kg (169 lb)  SpO2 99%  Discharge Medications:     Medication List    TAKE these medications        HYDROmorphone 2 MG tablet  Commonly known as:  DILAUDID  Take 1-2 tablets (2-4 mg total) by mouth every 3 (three) hours as needed for moderate pain or severe pain.     oxyCODONE-acetaminophen 5-325 MG per tablet  Commonly known as:  PERCOCET/ROXICET  Take 1-2 tablets  by mouth every 6 (six) hours as needed (pain).     promethazine 25 MG tablet  Commonly known as:  PHENERGAN  Take 1 tablet (25 mg total) by mouth every 4 (four) hours as needed for nausea or vomiting.        Diagnostic Studies: Dg Chest 2 View  05/19/2014   CLINICAL DATA:  Surgery.  Lumbar pain.  Former smoker.  EXAM: CHEST  2 VIEW  COMPARISON:  None.  FINDINGS: Mediastinum hilar structures normal. Hyperexpansion of both lungs noted consistent COPD. Heart is normal. No pleural effusion or pneumothorax. Acute bony abnormality.  IMPRESSION: COPD.  No acute cardiopulmonary disease.   Electronically Signed   By: Marcello Moores  Register   On: 05/19/2014 09:31   Dg Lumbar Spine 2-3 Views  05/20/2014   CLINICAL DATA:  L5-S1 posterior fusion.  EXAM: LUMBAR SPINE - 2-3 VIEW; DG C-ARM 61-120 MIN  COMPARISON:  Same day.  FLUOROSCOPY TIME:  39 seconds.  FINDINGS: Two intraoperative fluoroscopic images of the lower lumbar spine were obtained. These demonstrate the patient to be status post surgical posterior fusion of L5-S1 with bilateral intrapedicular screw placement and interbody fusion. Good alignment of vertebral bodies is noted.  IMPRESSION: Status post posterior fusion of L5-S1.   Electronically Signed   By: Marijo Conception, M.D.   On: 05/20/2014 13:04   Dg Lumbar Spine 1 View  05/20/2014   CLINICAL DATA:  Intraoperative localization film  EXAM:  LUMBAR SPINE - 1 VIEW  COMPARISON:  MRI 04/05/2009.  FINDINGS: The first needle is directed most closely between the spinous process of L5 and the first sacral segment. A second needle is directed at the mid sacrum, not specific enough for localization.  IMPRESSION: Intraoperative localization as described.   Electronically Signed   By: Rolla Flatten M.D.   On: 05/20/2014 12:58   Dg C-arm 1-60 Min  05/20/2014   CLINICAL DATA:  L5-S1 posterior fusion.  EXAM: LUMBAR SPINE - 2-3 VIEW; DG C-ARM 61-120 MIN  COMPARISON:  Same day.  FLUOROSCOPY TIME:  39 seconds.  FINDINGS:  Two intraoperative fluoroscopic images of the lower lumbar spine were obtained. These demonstrate the patient to be status post surgical posterior fusion of L5-S1 with bilateral intrapedicular screw placement and interbody fusion. Good alignment of vertebral bodies is noted.  IMPRESSION: Status post posterior fusion of L5-S1.   Electronically Signed   By: Marijo Conception, M.D.   On: 05/20/2014 13:04    Disposition: 01-Home or Self Care   POD #2 s/p L5/S1 decompression and fusion, doing well   - up with PT, encourage ambulation - Dilaudid for pain, Valium for muscle spasms, pain is better controlled today, so we will d/c oxycontin -Written scripts for pain signed and in chart -D/C instructions sheet printed and in chart -D/C today  -F/U in office 2 weeks   Signed: Justice Britain 06/10/2014, 1:09 PM

## 2014-08-21 ENCOUNTER — Encounter: Payer: Self-pay | Admitting: Family Medicine

## 2014-08-21 ENCOUNTER — Ambulatory Visit (INDEPENDENT_AMBULATORY_CARE_PROVIDER_SITE_OTHER): Payer: BLUE CROSS/BLUE SHIELD | Admitting: Family Medicine

## 2014-08-21 VITALS — BP 120/80 | HR 88 | Temp 98.4°F | Wt 176.0 lb

## 2014-08-21 DIAGNOSIS — J029 Acute pharyngitis, unspecified: Secondary | ICD-10-CM

## 2014-08-21 LAB — POCT RAPID STREP A (OFFICE): Rapid Strep A Screen: NEGATIVE

## 2014-08-21 MED ORDER — AMOXICILLIN-POT CLAVULANATE 875-125 MG PO TABS: 1.0000 | ORAL_TABLET | Freq: Two times a day (BID) | ORAL | Status: DC

## 2014-08-21 NOTE — Progress Notes (Signed)
   Subjective:    Patient ID: Todd Santana, male    DOB: 1970/10/25, 44 y.o.   MRN: [id number]  HPI   Patient was sore throat. Only on the right side. Onset yesterday. Denies any other symptoms. No nasal congestion, fever, chills, or cough. Symptoms are moderate. Improved with anti-inflammatory medication and hot liquids. Generally very healthy with no chronic medical problems  Past Medical History  Diagnosis Date  . Lumbago 04/01/2009   Past Surgical History  Procedure Laterality Date  . Spine surgery      ruptured disk 2002  . Broken leg  2000  . Back surgery      x2 total      reports that he has quit smoking. He has never used smokeless tobacco. He reports that he drinks alcohol. He reports that he does not use illicit drugs. family history includes Cancer in his other. No Known Allergies   Review of Systems  Constitutional: Negative for fever and chills.  HENT: Positive for sore throat. Negative for congestion.        Objective:   Physical Exam  Constitutional: He appears well-developed and well-nourished. No distress.  HENT:  Right Ear: External ear normal.  Left Ear: External ear normal.  Oropharynx erythema on the right side with probably some early yellowish exudate. No soft palate asymmetry.  Neck: Neck supple.  Minimal anterior cervical adenopathy  Cardiovascular: Normal rate and regular rhythm.   Pulmonary/Chest: Effort normal and breath sounds normal. No respiratory distress. He has no wheezes. He has no rales.          Assessment & Plan:   Acute pharyngitis. He has somewhat asymmetric findings on exam but no evidence for obvious peritonsillar abscess at this time. Continue symptomatic treatment. If he has new or worsening symptoms would have low threshold to start Augmentin 825 mg twice daily. Recommend salt water gargles. Follow-up as needed.

## 2014-08-21 NOTE — Progress Notes (Signed)
Pre visit review using our clinic review tool, if applicable. No additional management support is needed unless otherwise documented below in the visit note. 

## 2014-12-28 ENCOUNTER — Telehealth: Payer: Self-pay | Admitting: Family Medicine

## 2014-12-28 NOTE — Telephone Encounter (Signed)
Pt is aware to come to his 8:30 appt Fasting on 11/30.

## 2014-12-28 NOTE — Telephone Encounter (Signed)
Patient returned your call.

## 2014-12-30 ENCOUNTER — Encounter: Payer: Self-pay | Admitting: Family Medicine

## 2014-12-30 ENCOUNTER — Ambulatory Visit (INDEPENDENT_AMBULATORY_CARE_PROVIDER_SITE_OTHER): Payer: BLUE CROSS/BLUE SHIELD | Admitting: Family Medicine

## 2014-12-30 VITALS — BP 114/80 | HR 86 | Temp 97.8°F | Resp 14 | Ht 74.0 in | Wt 178.7 lb

## 2014-12-30 DIAGNOSIS — R3589 Other polyuria: Secondary | ICD-10-CM

## 2014-12-30 DIAGNOSIS — R631 Polydipsia: Secondary | ICD-10-CM | POA: Diagnosis not present

## 2014-12-30 DIAGNOSIS — R358 Other polyuria: Secondary | ICD-10-CM | POA: Diagnosis not present

## 2014-12-30 LAB — BASIC METABOLIC PANEL
BUN: 16 mg/dL (ref 6–23)
CHLORIDE: 106 meq/L (ref 96–112)
CO2: 29 meq/L (ref 19–32)
Calcium: 9.7 mg/dL (ref 8.4–10.5)
Creatinine, Ser: 1.14 mg/dL (ref 0.40–1.50)
GFR: 74.39 mL/min (ref >60.00)
GLUCOSE: 81 mg/dL (ref 70–99)
POTASSIUM: 4.3 meq/L (ref 3.5–5.1)
Sodium: 143 mEq/L (ref 135–145)

## 2014-12-30 NOTE — Progress Notes (Addendum)
   Subjective:    Patient ID: Todd Santana, male    DOB: 12-24-1970, 44 y.o.   MRN: [id number]  HPI Patient seen with concerns of approximately one-month history of polyuria and polydipsia. Symptoms came on fairly abruptly. No dietary changes. Rare caffeine use. Denies any burning with urination. Nocturia usually 1-2 times per night.  No decongestant use or other explanation for dry mouth. Takes no regular medications. Rare caffeine use. No weight loss. No family history of diabetes. Patient estimates he is drinking about 1 gallon of water per day which is unusual for him. No slow stream or obstructive urinary symptoms.  Past Medical History  Diagnosis Date  . Lumbago 04/01/2009   Past Surgical History  Procedure Laterality Date  . Spine surgery      ruptured disk 2002  . Broken leg  2000  . Back surgery      x2 total      reports that he has quit smoking. He has never used smokeless tobacco. He reports that he drinks alcohol. He reports that he does not use illicit drugs. family history includes Cancer in his other. No Known Allergies    Review of Systems  Constitutional: Negative for appetite change and unexpected weight change.  Respiratory: Negative for cough and shortness of breath.   Cardiovascular: Negative for chest pain.  Gastrointestinal: Negative for abdominal pain.  Endocrine: Positive for polydipsia and polyuria.  Genitourinary: Negative for dysuria and difficulty urinating.  Neurological: Negative for dizziness, weakness and headaches.       Objective:   Physical Exam  Constitutional: He appears well-developed and well-nourished.  HENT:  Mouth/Throat: Oropharynx is clear and moist.  Cardiovascular: Normal rate and regular rhythm.  Exam reveals no gallop.   No murmur heard. Pulmonary/Chest: Effort normal and breath sounds normal. No respiratory distress. He has no wheezes. He has no rales.  Musculoskeletal: He exhibits no edema.            Assessment & Plan:  Polyuria and polydipsia. Clinically, doubt type I or type 2 diabetes. He has not had any recent weight loss. Check capillary blood glucose. If normal, check basic metabolic panel and urine and serum osmolality to rule out central or nephrogenic diabetes insipidus.  Doubt primary polydipsia.  CBG 80.  Urine osmolality slightly low at 275 and normal plasma osmolality. BMP normal.  Pt notified.  Will check ADH level and consider 24 hour urine for volume.  Arginine vasopressin level less than 0.1. Will set up endocrine referral. Patient's symptoms are unchanged.  We have asked that he check 24-hour urine volume which he has not yet started

## 2014-12-30 NOTE — Progress Notes (Signed)
Pre visit review using our clinic review tool, if applicable. No additional management support is needed unless otherwise documented below in the visit note. 

## 2014-12-31 LAB — OSMOLALITY, URINE: Osmolality, Ur: 275 mOsm/kg — ABNORMAL LOW (ref 390–1090)

## 2014-12-31 LAB — OSMOLALITY: Osmolality: 296 mOsm/kg (ref 275–300)

## 2015-01-04 NOTE — Addendum Note (Signed)
Addended by: Eulas Post on: 01/04/2015 08:45 PM   Modules accepted: Orders

## 2015-01-05 ENCOUNTER — Other Ambulatory Visit (INDEPENDENT_AMBULATORY_CARE_PROVIDER_SITE_OTHER): Payer: BLUE CROSS/BLUE SHIELD

## 2015-01-05 DIAGNOSIS — R358 Other polyuria: Secondary | ICD-10-CM | POA: Diagnosis not present

## 2015-01-05 DIAGNOSIS — R631 Polydipsia: Secondary | ICD-10-CM

## 2015-01-05 DIAGNOSIS — R3589 Other polyuria: Secondary | ICD-10-CM

## 2015-01-14 LAB — ARGININE VASOPRESSIN HORMONE

## 2015-01-14 NOTE — Addendum Note (Signed)
Addended by: Eulas Post on: 01/14/2015 12:43 PM   Modules accepted: Orders

## 2015-01-19 ENCOUNTER — Encounter: Payer: Self-pay | Admitting: Family Medicine

## 2015-01-19 ENCOUNTER — Ambulatory Visit (INDEPENDENT_AMBULATORY_CARE_PROVIDER_SITE_OTHER): Payer: BLUE CROSS/BLUE SHIELD | Admitting: Family Medicine

## 2015-01-19 VITALS — BP 140/90 | Temp 98.5°F | Wt 179.0 lb

## 2015-01-19 DIAGNOSIS — J302 Other seasonal allergic rhinitis: Secondary | ICD-10-CM

## 2015-01-19 DIAGNOSIS — J309 Allergic rhinitis, unspecified: Secondary | ICD-10-CM | POA: Insufficient documentation

## 2015-01-19 MED ORDER — MONTELUKAST SODIUM 10 MG PO TABS: 10.0000 mg | ORAL_TABLET | Freq: Every day | ORAL | Status: DC

## 2015-01-19 MED ORDER — FLUTICASONE PROPIONATE 50 MCG/ACT NA SUSP: 2.0000 | Freq: Every day | NASAL | Status: DC

## 2015-01-19 NOTE — Progress Notes (Signed)
Pre visit review using our clinic review tool, if applicable. No additional management support is needed unless otherwise documented below in the visit note. 

## 2015-01-19 NOTE — Progress Notes (Signed)
   Subjective:    Patient ID: Todd Santana, male    DOB: November 23, 1970, 44 y.o.   MRN: [id number]  HPI Todd Santana is a 44 year old married male nonsmoker who comes in today for evaluation of allergic rhinitis  He has allergic rhinitis perennially. He takes no medication. He was exposed to some chlorine in a swimming pool last Friday and he thinks that's what triggered it. He's also living with his in-laws and they have a dog.  No history of asthma   Review of Systems Review of systems negative    Objective:   Physical Exam Well-developed well-nourished male no acute distress vital signs stable he is afebrile HEENT were negative specifically his nasal septum was in the midline no polyps or septal deviation       Assessment & Plan:  Allergic rhinitis...............Marland Kitchen plain antihistamine at bedtime........... steroid nasal spray twice a day........ add Singulair when necessary.

## 2015-01-19 NOTE — Patient Instructions (Signed)
Plain Zyrtec 10 mg...........Marland Kitchen 1 at bedtime  Plain Claritin 10 mg........Marland Kitchen 1 in the morning  Steroid nasal spray...........Marland Kitchen 1 shot up each nostril twice daily  Singulair 10 mg........Marland Kitchen 1 at bedtime

## 2015-01-22 ENCOUNTER — Ambulatory Visit (INDEPENDENT_AMBULATORY_CARE_PROVIDER_SITE_OTHER): Payer: BLUE CROSS/BLUE SHIELD | Admitting: Endocrinology

## 2015-01-22 ENCOUNTER — Encounter: Payer: Self-pay | Admitting: Endocrinology

## 2015-01-22 VITALS — BP 136/84 | HR 96 | Temp 98.0°F | Resp 14 | Ht 74.0 in | Wt 178.2 lb

## 2015-01-22 DIAGNOSIS — E232 Diabetes insipidus: Secondary | ICD-10-CM | POA: Diagnosis not present

## 2015-01-22 DIAGNOSIS — E291 Testicular hypofunction: Secondary | ICD-10-CM

## 2015-01-22 DIAGNOSIS — R5383 Other fatigue: Secondary | ICD-10-CM | POA: Diagnosis not present

## 2015-01-22 LAB — POCT URINALYSIS DIPSTICK
Glucose, UA: NEGATIVE
KETONES UA: NEGATIVE
Leukocytes, UA: NEGATIVE
Nitrite, UA: NEGATIVE
PH UA: 6
Protein, UA: NEGATIVE
RBC UA: NEGATIVE
Spec Grav, UA: 1.01
UROBILINOGEN UA: 0.2

## 2015-01-22 LAB — T4, FREE: Free T4: 0.67 ng/dL (ref 0.60–1.60)

## 2015-01-22 LAB — TESTOSTERONE: TESTOSTERONE: 265.25 ng/dL — AB (ref 300.00–890.00)

## 2015-01-22 MED ORDER — DESMOPRESSIN ACETATE 0.1 MG PO TABS: ORAL_TABLET | ORAL | Status: DC

## 2015-01-22 NOTE — Progress Notes (Signed)
Patient ID: Todd Santana, male   DOB: [birth year], 44 y.o.   MRN: [id number]            Chief complaint: Increased thirst and urination  History of Present Illness:  He started having a rapid onset of symptoms of increased thirst around November 1.   He found that he was not getting satisfied with drinking a lot of water and with continue to be thirsty day and night. Also started increasing the amount and frequency of urination including at night.  He is urinating at least 2-3 times at night He has not felt weak or lightheaded. No complaints of recent weight loss or blurred vision  He presented to his PCP with the symptoms and labs included serum chemistry as follows as well as osmolality studies:  Serum osmolality upper normal at 296, urine osmolality 275 He has measured his urine volume and it is 5 L  He is now here for further management  Lab Results  Component Value Date   CREATININE 1.14 12/30/2014   BUN 16 12/30/2014   NA 143 12/30/2014   K 4.3 12/30/2014   CL 106 12/30/2014   CO2 29 12/30/2014   Lab Results  Component Value Date   TSH 2.41 02/11/2013   FREET4 0.67 01/22/2015     Past Medical History  Diagnosis Date  . Lumbago 04/01/2009  . Allergy     Past Surgical History  Procedure Laterality Date  . Spine surgery      ruptured disk 2002  . Broken leg  2000  . Back surgery      x2 total      Family History  Problem Relation Age of Onset  . Cancer Other     grandmother, breast CA    Social History:  reports that he has quit smoking. He has never used smokeless tobacco. He reports that he drinks alcohol. He reports that he does not use illicit drugs.  Allergies: No Known Allergies    Medication List       This list is accurate as of: 01/22/15 11:59 PM.  Always use your most recent med list.               cetirizine 10 MG tablet  Commonly known as:  ZYRTEC  Take 10 mg by mouth daily.     desmopressin 0.1 MG tablet  Commonly known as:   DDAVP  1 tablet every 8 hours     fluticasone 50 MCG/ACT nasal spray  Commonly known as:  FLONASE  Place 2 sprays into both nostrils daily.     loratadine 10 MG tablet  Commonly known as:  CLARITIN  Take 10 mg by mouth daily.     montelukast 10 MG tablet  Commonly known as:  SINGULAIR  Take 1 tablet (10 mg total) by mouth at bedtime.        LABS:  Office Visit on 01/22/2015  Component Date Value Ref Range Status  . Color, UA 01/22/2015 yellow   Final  . Clarity, UA 01/22/2015 clear   Final  . Glucose, UA 01/22/2015 negative   Final  . Bilirubin, UA 01/22/2015 small   Final  . Ketones, UA 01/22/2015 negative   Final  . Spec Grav, UA 01/22/2015 1.010   Final  . Blood, UA 01/22/2015 negative   Final  . pH, UA 01/22/2015 6.0   Final  . Protein, UA 01/22/2015 negative   Final  . Urobilinogen, UA 01/22/2015 0.2   Final  .  Nitrite, UA 01/22/2015 negative   Final  . Leukocytes, UA 01/22/2015 Negative  Negative Final  . Free T4 01/22/2015 0.67  0.60 - 1.60 ng/dL Final  . Testosterone 01/22/2015 265.25* 300.00 - 890.00 ng/dL Final     REVIEW OF SYSTEMS:        Review of Systems  Constitutional: Positive for weight gain and malaise.       He has gained some weight this year since his low back surgery. His wife thinks that he is tending to be more tired than usual  HENT: Positive for nasal congestion. Negative for headaches.        Recently he has had nasal and frontal sinus congestion with discomfort, treated as allergies  Eyes: Negative for visual disturbance.       He had a relatively normal eye exam last month  Gastrointestinal: Positive for constipation. Negative for nausea.  Endocrine: Positive for fatigue and cold intolerance. Negative for decreased libido and erectile dysfunction.       Fatigue present for about 3 months He tends to be recorded than usual and is dressing more warmly  Genitourinary: Positive for nocturia.       Getting up at night 2-3 times.     Musculoskeletal: Negative for joint pain.  Skin: Negative for rash.  Neurological: Negative for numbness and tingling.  Psychiatric/Behavioral: Positive for insomnia.       Tends to have interrupted sleep     PHYSICAL EXAM:  BP 136/84 mmHg  Pulse 96  Temp(Src) 98 F (36.7 C)  Resp 14  Ht 6\' 2"  (1.88 m)  Wt 178 lb 3.2 oz (80.831 kg)  BMI 22.87 kg/m2  SpO2 98%  GENERAL: Averagely built and nourished.  No pallor, clubbing, lymphadenopathy or edema.  Skin:  no rash or pigmentation.  EYES:  Externally normal.  Fundii:  normal discs and vessels.  ENT: Oral mucosa and tongue normal.  THYROID:  Not palpable.  HEART:  Normal  S1 and S2; no murmur or click.  CHEST:  Normal shape.  Lungs: Vescicular breath sounds heard equally.  No crepitations/ wheeze.  ABDOMEN:  No distention.  Liver and spleen not palpable.  No other mass or tenderness.  NEUROLOGICAL: .Reflexes are bilaterally normal at ankles, difficult to elicit at biceps.  JOINTS:  Normal.   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  He has had relatively rapid onset of symptoms of increased thirst and urination with a urine volume reportedly 5 L.  Has upper normal serum osmolality and although his urine osmolality  is not very low it is relatively lower than the serum osmolality.  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   Currently does not have any headaches or visual disturbances indicating an acute structural process in the pituitary gland  He does have some symptoms suggestive of hypothyroidism including fatigue, weight gain and cold intolerance but no symptoms suggestive of testosterone deficiency   PLAN:    Check urine specific gravity to confirm dilute urine  Since he is fairly symptomatic and will not be able to do a water deprivation test until next week he will be given a therapeutic trial of DDAVP 0.1 mg 3 times a day  Check  of the pituitary functions, will order free T4 and testosterone for now and consider checking IGF-I also  MRI pituitary gland to rule out structural disease  Fujie Dickison 01/23/2015, 3:51 PM

## 2015-01-23 ENCOUNTER — Encounter: Payer: Self-pay | Admitting: Endocrinology

## 2015-01-28 ENCOUNTER — Other Ambulatory Visit (INDEPENDENT_AMBULATORY_CARE_PROVIDER_SITE_OTHER): Payer: BLUE CROSS/BLUE SHIELD

## 2015-01-28 ENCOUNTER — Telehealth: Payer: Self-pay | Admitting: Family Medicine

## 2015-01-28 DIAGNOSIS — E232 Diabetes insipidus: Secondary | ICD-10-CM

## 2015-01-28 DIAGNOSIS — E291 Testicular hypofunction: Secondary | ICD-10-CM | POA: Diagnosis not present

## 2015-01-28 LAB — BASIC METABOLIC PANEL
BUN: 15 mg/dL (ref 6–23)
CHLORIDE: 103 meq/L (ref 96–112)
CO2: 30 mEq/L (ref 19–32)
CREATININE: 1.09 mg/dL (ref 0.40–1.50)
Calcium: 9.4 mg/dL (ref 8.4–10.5)
GFR: 78.31 mL/min (ref >60.00)
Glucose, Bld: 84 mg/dL (ref 70–99)
Potassium: 4.3 mEq/L (ref 3.5–5.1)
Sodium: 139 mEq/L (ref 135–145)

## 2015-01-28 LAB — LUTEINIZING HORMONE: LH: 5.72 m[IU]/mL (ref 1.50–9.30)

## 2015-01-28 NOTE — Telephone Encounter (Signed)
Mardene Celeste from Endocrinology would like for you to call her regarding a referral for an MRI Her number is [phone]

## 2015-01-29 ENCOUNTER — Other Ambulatory Visit: Payer: Self-pay | Admitting: Endocrinology

## 2015-01-29 ENCOUNTER — Ambulatory Visit (INDEPENDENT_AMBULATORY_CARE_PROVIDER_SITE_OTHER): Payer: BLUE CROSS/BLUE SHIELD | Admitting: Endocrinology

## 2015-01-29 ENCOUNTER — Other Ambulatory Visit: Payer: BLUE CROSS/BLUE SHIELD

## 2015-01-29 ENCOUNTER — Encounter: Payer: Self-pay | Admitting: Endocrinology

## 2015-01-29 VITALS — BP 128/76 | HR 80 | Temp 97.6°F | Resp 14 | Ht 74.0 in | Wt 182.6 lb

## 2015-01-29 DIAGNOSIS — E232 Diabetes insipidus: Secondary | ICD-10-CM

## 2015-01-29 DIAGNOSIS — R7989 Other specified abnormal findings of blood chemistry: Secondary | ICD-10-CM

## 2015-01-29 DIAGNOSIS — E291 Testicular hypofunction: Secondary | ICD-10-CM | POA: Diagnosis not present

## 2015-01-29 NOTE — Patient Instructions (Signed)
Take Rx every 12 hrs

## 2015-01-29 NOTE — Progress Notes (Signed)
Patient ID: Todd Santana, male   DOB: [birth year], 44 y.o.   MRN: [id number]            Chief complaint: Increased thirst and urination  History of Present Illness:  He started having a rapid onset of symptoms of increased thirst around November 1.   He found that he was not getting satisfied with drinking a lot of water and with continue to be thirsty day and night. Also started increasing the amount and frequency of urination including 2-3 times at night.  Initial labs:  Serum osmolality upper normal at 296, urine osmolality 275.  Serum sodium 143 He has measured his urine volume and it is 5 L  Since urine specific gravity was also low at 1010 he was started on DDAVP 0.1 mg 3 times a day. However he only started this about 36 hours ago as it was not available at pharmacy He has not had to urinate much at all and not complaining of thirst He thinks the medication does inhibit urination significantly  Recently not having any headaches or peripheral vision difficulties His testosterone level and free T4 low normal with normal LH  Lab Results  Component Value Date   CREATININE 1.09 01/28/2015   BUN 15 01/28/2015   NA 139 01/28/2015   K 4.3 01/28/2015   CL 103 01/28/2015   CO2 30 01/28/2015   Lab Results  Component Value Date   TSH 2.41 02/11/2013   FREET4 0.67 01/22/2015     Past Medical History  Diagnosis Date  . Lumbago 04/01/2009  . Allergy     Past Surgical History  Procedure Laterality Date  . Spine surgery      ruptured disk 2002  . Broken leg  2000  . Back surgery      x2 total      Family History  Problem Relation Age of Onset  . Cancer Other     grandmother, breast CA    Social History:  reports that he has quit smoking. He has never used smokeless tobacco. He reports that he drinks alcohol. He reports that he does not use illicit drugs.  Allergies: No Known Allergies    Medication List       This list is accurate as of: 01/29/15  9:14 AM.   Always use your most recent med list.               cetirizine 10 MG tablet  Commonly known as:  ZYRTEC  Take 10 mg by mouth daily.     desmopressin 0.1 MG tablet  Commonly known as:  DDAVP  1 tablet every 8 hours     fluticasone 50 MCG/ACT nasal spray  Commonly known as:  FLONASE  Place 2 sprays into both nostrils daily.     loratadine 10 MG tablet  Commonly known as:  CLARITIN  Take 10 mg by mouth daily.     montelukast 10 MG tablet  Commonly known as:  SINGULAIR  Take 1 tablet (10 mg total) by mouth at bedtime.        LABS:  Lab on 01/28/2015  Component Date Value Ref Range Status  . Sodium 01/28/2015 139  135 - 145 mEq/L Final  . Potassium 01/28/2015 4.3  3.5 - 5.1 mEq/L Final  . Chloride 01/28/2015 103  96 - 112 mEq/L Final  . CO2 01/28/2015 30  19 - 32 mEq/L Final  . Glucose, Bld 01/28/2015 84  70 - 99 mg/dL Final  .  BUN 01/28/2015 15  6 - 23 mg/dL Final  . Creatinine, Ser 01/28/2015 1.09  0.40 - 1.50 mg/dL Final  . Calcium 01/28/2015 9.4  8.4 - 10.5 mg/dL Final  . GFR 01/28/2015 78.31  >60.00 mL/min Final  . LH 01/28/2015 5.72  1.50 - 9.30 mIU/mL Final   Comment: Male Reference Range:20-70 yrs     1.5-9.3 mIU/mL>70 yrs       3.1-35.6 mIU/mLFemale Reference Range:Follicular Phase     [id number] mIU/mLMidcycle             8.7-76.3 mIU/mLLuteal Phase         0.5-16.9 mIU/mL  Post Menopausal      15.9-54.0  mIU/mLPregnant             <1.5 mIU/mLContraceptives       0.7-5.6 mIU/mL      REVIEW OF SYSTEMS:        Review of Systems   PHYSICAL EXAM:  BP 128/76 mmHg  Pulse 80  Temp(Src) 97.6 F (36.4 C)  Resp 14  Ht 6\' 2"  (1.88 m)  Wt 182 lb 9.6 oz (82.827 kg)  BMI 23.43 kg/m2  SpO2 99%   ASSESSMENT:     central diabetes insipidus.  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

## 2015-01-31 LAB — PROLACTIN: Prolactin: 23 ng/mL — ABNORMAL HIGH (ref 4.0–15.2)

## 2015-02-08 ENCOUNTER — Inpatient Hospital Stay: Admission: RE | Admit: 2015-02-08 | Payer: BLUE CROSS/BLUE SHIELD | Source: Ambulatory Visit

## 2015-02-09 ENCOUNTER — Ambulatory Visit
Admission: RE | Admit: 2015-02-09 | Discharge: 2015-02-09 | Disposition: A | Discharge status: Home / Self Care | Payer: BLUE CROSS/BLUE SHIELD | Source: Ambulatory Visit | Attending: Endocrinology | Admitting: Endocrinology

## 2015-02-09 ENCOUNTER — Other Ambulatory Visit: Payer: Self-pay | Admitting: Endocrinology

## 2015-02-09 DIAGNOSIS — E236 Other disorders of pituitary gland: Secondary | ICD-10-CM

## 2015-02-09 DIAGNOSIS — E232 Diabetes insipidus: Secondary | ICD-10-CM

## 2015-02-09 DIAGNOSIS — E291 Testicular hypofunction: Secondary | ICD-10-CM

## 2015-02-09 MED ORDER — GADOBENATE DIMEGLUMINE 529 MG/ML IV SOLN
9.0000 mL | Freq: Once | INTRAVENOUS | Status: AC | PRN
  Administered 2015-02-09: 9 mL via INTRAVENOUS

## 2015-02-10 ENCOUNTER — Other Ambulatory Visit (INDEPENDENT_AMBULATORY_CARE_PROVIDER_SITE_OTHER): Payer: BLUE CROSS/BLUE SHIELD

## 2015-02-10 DIAGNOSIS — E236 Other disorders of pituitary gland: Secondary | ICD-10-CM

## 2015-02-10 DIAGNOSIS — E232 Diabetes insipidus: Secondary | ICD-10-CM

## 2015-02-10 LAB — URINALYSIS, ROUTINE W REFLEX MICROSCOPIC
BILIRUBIN URINE: NEGATIVE
HGB URINE DIPSTICK: NEGATIVE
Ketones, ur: NEGATIVE
LEUKOCYTES UA: NEGATIVE
NITRITE: NEGATIVE
RBC / HPF: NONE SEEN (ref 0–?)
Specific Gravity, Urine: 1.02 (ref 1.000–1.030)
TOTAL PROTEIN, URINE-UPE24: NEGATIVE
UROBILINOGEN UA: 0.2 (ref 0.0–1.0)
Urine Glucose: NEGATIVE
WBC UA: NONE SEEN (ref 0–?)
pH: 6 (ref 5.0–8.0)

## 2015-02-10 LAB — BASIC METABOLIC PANEL
BUN: 21 mg/dL (ref 6–23)
CHLORIDE: 103 meq/L (ref 96–112)
CO2: 29 meq/L (ref 19–32)
Calcium: 9.4 mg/dL (ref 8.4–10.5)
Creatinine, Ser: 1.13 mg/dL (ref 0.40–1.50)
GFR: 75.11 mL/min (ref >60.00)
Glucose, Bld: 85 mg/dL (ref 70–99)
Potassium: 4.3 mEq/L (ref 3.5–5.1)
SODIUM: 139 meq/L (ref 135–145)

## 2015-02-10 LAB — CORTISOL: CORTISOL PLASMA: 11 ug/dL

## 2015-02-11 LAB — INSULIN-LIKE GROWTH FACTOR: INSULIN LIKE GF 1: 138 ng/mL (ref 75–216)

## 2015-02-11 NOTE — Progress Notes (Signed)
Quick Note:  Please let patient know that the lab results are all normal ______

## 2015-02-15 ENCOUNTER — Telehealth: Payer: Self-pay | Admitting: Endocrinology

## 2015-02-15 ENCOUNTER — Other Ambulatory Visit: Payer: BLUE CROSS/BLUE SHIELD

## 2015-02-15 NOTE — Telephone Encounter (Signed)
Message was left for patient, referral was made on 02/10/2015, we are waiting for the surgeons office to schedule the appointment.

## 2015-02-15 NOTE — Telephone Encounter (Signed)
Patient stated he no one has called him to schedule his NEUROSURGERY appointment., please advise

## 2015-02-19 NOTE — Telephone Encounter (Signed)
error 

## 2015-03-02 ENCOUNTER — Other Ambulatory Visit: Payer: Self-pay | Admitting: Neurosurgery

## 2015-03-02 ENCOUNTER — Other Ambulatory Visit (HOSPITAL_COMMUNITY): Payer: Self-pay | Admitting: Otolaryngology

## 2015-03-02 NOTE — H&P (Signed)
03/05/15 11:49 AM  Todd Santana  PREOPERATIVE HISTORY AND PHYSICAL  CHIEF COMPLAINT: pituitary mass  HISTORY: This is a 45 year old who presents with pituitary mass.  He now presents for combined transsphenoidal resection with Neurosurgery.  Dr. Simeon Craft, Alroy Dust has discussed the risks (bleeding, infection, hormone imbalances, blindness, CSF leak, perioperative complications, etc.) benefits, and alternatives of this procedure. The patient understands the risks and would like to proceed with the procedure. The chances of success of the procedure are >50%  and the patient understands this. I personally performed an examination of the patient within 24 hours of the procedure.  PAST MEDICAL HISTORY: Past Medical History  Diagnosis Date  . Lumbago 04/01/2009  . Allergy      PAST SURGICAL HISTORY: Past Surgical History  Procedure Laterality Date  . Spine surgery      ruptured disk 2002  . Broken leg  2000  . Back surgery      x2 total      MEDICATIONS: No current facility-administered medications on file prior to encounter.   Current Outpatient Prescriptions on File Prior to Encounter  Medication Sig Dispense Refill  . cetirizine (ZYRTEC) 10 MG tablet Take 10 mg by mouth daily.    Marland Kitchen desmopressin (DDAVP) 0.1 MG tablet 1 tablet every 8 hours 90 tablet 1  . fluticasone (FLONASE) 50 MCG/ACT nasal spray Place 2 sprays into both nostrils daily. 32 g 6  . loratadine (CLARITIN) 10 MG tablet Take 10 mg by mouth daily.    . montelukast (SINGULAIR) 10 MG tablet Take 1 tablet (10 mg total) by mouth at bedtime. 100 tablet 3    ALLERGIES: No Known Allergies    SOCIAL HISTORY: Social History   Social History  . Marital Status: Married    Spouse Name: N/A  . Number of Children: N/A  . Years of Education: N/A   Occupational History  . Not on file.   Social History Main Topics  . Smoking status: Former Research scientist (life sciences)  . Smokeless tobacco: Never Used  . Alcohol Use: Yes     Comment: occ  .  Drug Use: No  . Sexual Activity: Not on file   Other Topics Concern  . Not on file   Social History Narrative    FAMILY HISTORY: Family History  Problem Relation Age of Onset  . Cancer Other     grandmother, breast CA    REVIEW OF SYSTEMS:  Endocrine: uses desmopressin for diabetes insipidus, otherwise negative x 12 systems except per HPI   PHYSICAL EXAM:  GENERAL:  NAD VITAL SIGNS:   Filed Vitals:   03/05/15 0825  BP: 128/87  Pulse: 74  Temp: 97.5 F (36.4 C)  Resp: 18   SKIN:  Warm, dry HEENT:  Oral cavity clear NECK:  supple LYMPH:  No LAD ABDOMEN:  soft MUSCULOSKELETAL: normal strength PSYCH:  Normal affect NEUROLOGIC:  CN 2-12 intact and symmetric  DIAGNOSTIC STUDIES: MRI shows ~1cm pituitary mass, clear paranasal sinuses with leftward septal deviation.  ASSESSMENT AND PLAN: Plan to proceed with combined transsphenoidal resection of pituitary tumor with Dr. Christella Noa. Patient understands the risks, benefits, and alternatives. Informed written consent signed, witnessed, and on chart. 03/05/15 Todd Santana

## 2015-03-03 ENCOUNTER — Encounter (HOSPITAL_COMMUNITY): Payer: Self-pay | Admitting: *Deleted

## 2015-03-03 NOTE — Progress Notes (Signed)
Pt denies cardiac history, chest pain or sob. 

## 2015-03-05 ENCOUNTER — Inpatient Hospital Stay (HOSPITAL_COMMUNITY): Payer: BLUE CROSS/BLUE SHIELD | Admitting: Anesthesiology

## 2015-03-05 ENCOUNTER — Encounter (HOSPITAL_COMMUNITY)
Admission: RE | Disposition: A | Discharge status: Home / Self Care | Payer: Self-pay | Source: Ambulatory Visit | Attending: Neurosurgery

## 2015-03-05 ENCOUNTER — Inpatient Hospital Stay (HOSPITAL_COMMUNITY)
Admission: RE | Admit: 2015-03-05 | Discharge: 2015-03-10 | DRG: 614 | Disposition: A | Discharge status: Home / Self Care | Payer: BLUE CROSS/BLUE SHIELD | Source: Ambulatory Visit | Attending: Neurosurgery | Admitting: Neurosurgery

## 2015-03-05 ENCOUNTER — Encounter (HOSPITAL_COMMUNITY): Payer: Self-pay

## 2015-03-05 DIAGNOSIS — Z79899 Other long term (current) drug therapy: Secondary | ICD-10-CM

## 2015-03-05 DIAGNOSIS — Z981 Arthrodesis status: Secondary | ICD-10-CM

## 2015-03-05 DIAGNOSIS — G960 Cerebrospinal fluid leak: Secondary | ICD-10-CM | POA: Diagnosis not present

## 2015-03-05 DIAGNOSIS — E232 Diabetes insipidus: Secondary | ICD-10-CM | POA: Diagnosis present

## 2015-03-05 DIAGNOSIS — E236 Other disorders of pituitary gland: Secondary | ICD-10-CM | POA: Diagnosis present

## 2015-03-05 DIAGNOSIS — D352 Benign neoplasm of pituitary gland: Secondary | ICD-10-CM | POA: Diagnosis present

## 2015-03-05 DIAGNOSIS — Z87891 Personal history of nicotine dependence: Secondary | ICD-10-CM

## 2015-03-05 DIAGNOSIS — D497 Neoplasm of unspecified behavior of endocrine glands and other parts of nervous system: Secondary | ICD-10-CM | POA: Diagnosis present

## 2015-03-05 HISTORY — PX: SINUS ENDO W/FUSION: SHX777

## 2015-03-05 HISTORY — PX: CRANIOTOMY: SHX93

## 2015-03-05 LAB — SODIUM: SODIUM: 140 mmol/L (ref 135–145)

## 2015-03-05 LAB — BASIC METABOLIC PANEL
Anion gap: 8 (ref 5–15)
BUN: 15 mg/dL (ref 6–20)
CALCIUM: 9.1 mg/dL (ref 8.9–10.3)
CO2: 26 mmol/L (ref 22–32)
CREATININE: 0.99 mg/dL (ref 0.61–1.24)
Chloride: 103 mmol/L (ref 101–111)
GFR calc non Af Amer: >60 mL/min (ref >60)
Glucose, Bld: 90 mg/dL (ref 65–99)
Potassium: 5 mmol/L (ref 3.5–5.1)
Sodium: 137 mmol/L (ref 135–145)

## 2015-03-05 LAB — CBC
HCT: 40.6 % (ref 39.0–52.0)
Hemoglobin: 14.3 g/dL (ref 13.0–17.0)
MCH: 31.9 pg (ref 26.0–34.0)
MCHC: 35.2 g/dL (ref 30.0–36.0)
MCV: 90.6 fL (ref 78.0–100.0)
PLATELETS: 185 10*3/uL (ref 150–400)
RBC: 4.48 MIL/uL (ref 4.22–5.81)
RDW: 13 % (ref 11.5–15.5)
WBC: 5.5 10*3/uL (ref 4.0–10.5)

## 2015-03-05 SURGERY — CRANIOTOMY HYPOPHYSECTOMY TRANSNASAL APPROACH
Anesthesia: General

## 2015-03-05 MED ORDER — FENTANYL CITRATE (PF) 250 MCG/5ML IJ SOLN
INTRAMUSCULAR | Status: AC
  Filled 2015-03-05: qty 5

## 2015-03-05 MED ORDER — PROMETHAZINE HCL 25 MG/ML IJ SOLN
INTRAMUSCULAR | Status: AC
  Filled 2015-03-05: qty 1

## 2015-03-05 MED ORDER — DOCUSATE SODIUM 100 MG PO CAPS
100.0000 mg | ORAL_CAPSULE | Freq: Two times a day (BID) | ORAL | Status: DC
  Administered 2015-03-06 – 2015-03-10 (×8): 100 mg via ORAL
  Filled 2015-03-05 (×9): qty 1

## 2015-03-05 MED ORDER — PROMETHAZINE HCL 25 MG PO TABS
12.5000 mg | ORAL_TABLET | ORAL | Status: DC | PRN
  Administered 2015-03-05: 25 mg via ORAL
  Filled 2015-03-05: qty 1

## 2015-03-05 MED ORDER — BISACODYL 5 MG PO TBEC: 5.0000 mg | DELAYED_RELEASE_TABLET | Freq: Every day | ORAL | Status: DC | PRN

## 2015-03-05 MED ORDER — MIDAZOLAM HCL 2 MG/2ML IJ SOLN
INTRAMUSCULAR | Status: AC
  Filled 2015-03-05: qty 2

## 2015-03-05 MED ORDER — ONDANSETRON HCL 4 MG/2ML IJ SOLN
INTRAMUSCULAR | Status: DC | PRN
  Administered 2015-03-05 (×2): 4 mg via INTRAVENOUS

## 2015-03-05 MED ORDER — NEOSTIGMINE METHYLSULFATE 10 MG/10ML IV SOLN
INTRAVENOUS | Status: DC | PRN
  Administered 2015-03-05: 3 mg via INTRAVENOUS

## 2015-03-05 MED ORDER — SODIUM CHLORIDE 0.9 % IV SOLN
500.0000 mg | Freq: Two times a day (BID) | INTRAVENOUS | Status: DC
  Administered 2015-03-05 – 2015-03-09 (×8): 500 mg via INTRAVENOUS
  Filled 2015-03-05 (×8): qty 5

## 2015-03-05 MED ORDER — LACTATED RINGERS IV SOLN
INTRAVENOUS | Status: DC | PRN
  Administered 2015-03-05 (×2): via INTRAVENOUS

## 2015-03-05 MED ORDER — OXYMETAZOLINE HCL 0.05 % NA SOLN
NASAL | Status: DC | PRN
  Administered 2015-03-05: 1

## 2015-03-05 MED ORDER — SENNOSIDES-DOCUSATE SODIUM 8.6-50 MG PO TABS: 1.0000 | ORAL_TABLET | Freq: Every evening | ORAL | Status: DC | PRN

## 2015-03-05 MED ORDER — CEFAZOLIN SODIUM-DEXTROSE 2-3 GM-% IV SOLR
INTRAVENOUS | Status: DC | PRN
  Administered 2015-03-05: 2 g via INTRAVENOUS

## 2015-03-05 MED ORDER — PHENYLEPHRINE HCL 10 MG/ML IJ SOLN
10.0000 mg | INTRAVENOUS | Status: DC | PRN
  Administered 2015-03-05: 5 ug/min via INTRAVENOUS

## 2015-03-05 MED ORDER — HYDROCORTISONE 5 MG PO TABS
25.0000 mg | ORAL_TABLET | Freq: Three times a day (TID) | ORAL | Status: DC
  Administered 2015-03-07 – 2015-03-08 (×5): 25 mg via ORAL
  Filled 2015-03-05 (×6): qty 1

## 2015-03-05 MED ORDER — FLUORESCEIN SODIUM 1 MG OP STRP
ORAL_STRIP | OPHTHALMIC | Status: AC
  Filled 2015-03-05: qty 1

## 2015-03-05 MED ORDER — DEXAMETHASONE SODIUM PHOSPHATE 10 MG/ML IJ SOLN
INTRAMUSCULAR | Status: AC
  Filled 2015-03-05: qty 1

## 2015-03-05 MED ORDER — GLYCOPYRROLATE 0.2 MG/ML IJ SOLN
INTRAMUSCULAR | Status: DC | PRN
  Administered 2015-03-05: 0.4 mg via INTRAVENOUS

## 2015-03-05 MED ORDER — NALOXONE HCL 0.4 MG/ML IJ SOLN: 0.0800 mg | INTRAMUSCULAR | Status: DC | PRN

## 2015-03-05 MED ORDER — HYDROMORPHONE HCL 1 MG/ML IJ SOLN: 0.2500 mg | INTRAMUSCULAR | Status: DC | PRN

## 2015-03-05 MED ORDER — GELATIN ABSORBABLE MT POWD
OROMUCOSAL | Status: DC | PRN
  Administered 2015-03-05: 12:00:00 via TOPICAL

## 2015-03-05 MED ORDER — PROPOFOL 10 MG/ML IV BOLUS
INTRAVENOUS | Status: DC | PRN
  Administered 2015-03-05: 200 mg via INTRAVENOUS

## 2015-03-05 MED ORDER — ROCURONIUM BROMIDE 100 MG/10ML IV SOLN
INTRAVENOUS | Status: DC | PRN
  Administered 2015-03-05: 50 mg via INTRAVENOUS

## 2015-03-05 MED ORDER — LIDOCAINE-EPINEPHRINE 0.5 %-1:200000 IJ SOLN
INTRAMUSCULAR | Status: DC | PRN
  Administered 2015-03-05: 3 mL

## 2015-03-05 MED ORDER — FENTANYL CITRATE (PF) 100 MCG/2ML IJ SOLN
INTRAMUSCULAR | Status: DC | PRN
  Administered 2015-03-05 (×2): 50 ug via INTRAVENOUS
  Administered 2015-03-05: 100 ug via INTRAVENOUS
  Administered 2015-03-05: 50 ug via INTRAVENOUS

## 2015-03-05 MED ORDER — FLEET ENEMA 7-19 GM/118ML RE ENEM: 1.0000 | ENEMA | Freq: Once | RECTAL | Status: DC | PRN

## 2015-03-05 MED ORDER — MIDAZOLAM HCL 5 MG/5ML IJ SOLN
INTRAMUSCULAR | Status: DC | PRN
  Administered 2015-03-05: 2 mg via INTRAVENOUS

## 2015-03-05 MED ORDER — HYDROCODONE-ACETAMINOPHEN 5-325 MG PO TABS
1.0000 | ORAL_TABLET | ORAL | Status: DC | PRN
  Administered 2015-03-09 (×2): 1 via ORAL
  Filled 2015-03-05 (×2): qty 1

## 2015-03-05 MED ORDER — PHENYLEPHRINE HCL 10 MG/ML IJ SOLN
INTRAMUSCULAR | Status: DC | PRN
  Administered 2015-03-05: 40 ug via INTRAVENOUS

## 2015-03-05 MED ORDER — POTASSIUM CHLORIDE IN NACL 20-0.9 MEQ/L-% IV SOLN
INTRAVENOUS | Status: DC
  Administered 2015-03-05 – 2015-03-09 (×4): via INTRAVENOUS
  Filled 2015-03-05 (×10): qty 1000

## 2015-03-05 MED ORDER — PANTOPRAZOLE SODIUM 40 MG IV SOLR
40.0000 mg | Freq: Every day | INTRAVENOUS | Status: DC
  Administered 2015-03-05 – 2015-03-07 (×3): 40 mg via INTRAVENOUS
  Filled 2015-03-05 (×3): qty 40

## 2015-03-05 MED ORDER — LABETALOL HCL 5 MG/ML IV SOLN
10.0000 mg | INTRAVENOUS | Status: DC | PRN
Start: 2015-03-05 — End: 2015-03-10

## 2015-03-05 MED ORDER — ONDANSETRON HCL 4 MG/2ML IJ SOLN
INTRAMUSCULAR | Status: AC
  Filled 2015-03-05: qty 2

## 2015-03-05 MED ORDER — PROPOFOL 10 MG/ML IV BOLUS
INTRAVENOUS | Status: AC
  Filled 2015-03-05: qty 40

## 2015-03-05 MED ORDER — MORPHINE SULFATE (PF) 2 MG/ML IV SOLN: 1.0000 mg | INTRAVENOUS | Status: DC | PRN

## 2015-03-05 MED ORDER — HEMOSTATIC AGENTS (NO CHARGE) OPTIME
TOPICAL | Status: DC | PRN
  Administered 2015-03-05: 1 via TOPICAL

## 2015-03-05 MED ORDER — ARTIFICIAL TEARS OP OINT
TOPICAL_OINTMENT | OPHTHALMIC | Status: DC | PRN
  Administered 2015-03-05: 1 via OPHTHALMIC

## 2015-03-05 MED ORDER — LORATADINE 10 MG PO TABS
10.0000 mg | ORAL_TABLET | Freq: Every day | ORAL | Status: DC | PRN
Start: 2015-03-05 — End: 2015-03-10

## 2015-03-05 MED ORDER — LIDOCAINE-EPINEPHRINE 1 %-1:100000 IJ SOLN
INTRAMUSCULAR | Status: DC | PRN
  Administered 2015-03-05: 20 mL

## 2015-03-05 MED ORDER — ONDANSETRON HCL 4 MG PO TABS: 4.0000 mg | ORAL_TABLET | ORAL | Status: DC | PRN

## 2015-03-05 MED ORDER — ONDANSETRON HCL 4 MG/2ML IJ SOLN
4.0000 mg | INTRAMUSCULAR | Status: DC | PRN
  Administered 2015-03-05: 4 mg via INTRAVENOUS

## 2015-03-05 MED ORDER — SODIUM CHLORIDE 0.9 % IR SOLN
Status: DC | PRN
  Administered 2015-03-05: 1000 mL

## 2015-03-05 MED ORDER — LACTATED RINGERS IV SOLN
INTRAVENOUS | Status: DC
  Administered 2015-03-05: 09:00:00 via INTRAVENOUS

## 2015-03-05 MED ORDER — LIDOCAINE HCL (CARDIAC) 20 MG/ML IV SOLN
INTRAVENOUS | Status: DC | PRN
  Administered 2015-03-05: 50 mg via INTRAVENOUS

## 2015-03-05 MED ORDER — CLINDAMYCIN PHOSPHATE 600 MG/50ML IV SOLN
600.0000 mg | Freq: Once | INTRAVENOUS | Status: DC
  Filled 2015-03-05: qty 50

## 2015-03-05 MED ORDER — HYDROCORTISONE 10 MG PO TABS
50.0000 mg | ORAL_TABLET | Freq: Three times a day (TID) | ORAL | Status: AC
  Administered 2015-03-05 – 2015-03-06 (×4): 50 mg via ORAL
  Filled 2015-03-05 (×4): qty 1

## 2015-03-05 MED ORDER — FLUORESCEIN SODIUM 1 MG OP STRP
ORAL_STRIP | OPHTHALMIC | Status: AC
  Filled 2015-03-05: qty 2

## 2015-03-05 MED ORDER — DEXAMETHASONE SODIUM PHOSPHATE 4 MG/ML IJ SOLN
6.0000 mg | Freq: Once | INTRAMUSCULAR | Status: AC
  Administered 2015-03-05: 6 mg via INTRAVENOUS
  Filled 2015-03-05: qty 2

## 2015-03-05 MED ORDER — PROMETHAZINE HCL 25 MG/ML IJ SOLN
6.2500 mg | INTRAMUSCULAR | Status: DC | PRN
  Administered 2015-03-05: 6.25 mg via INTRAVENOUS

## 2015-03-05 MED ORDER — LIDOCAINE-EPINEPHRINE 1 %-1:100000 IJ SOLN
INTRAMUSCULAR | Status: AC
  Filled 2015-03-05: qty 1

## 2015-03-05 MED ORDER — DESMOPRESSIN ACETATE 0.1 MG PO TABS
0.1000 mg | ORAL_TABLET | Freq: Three times a day (TID) | ORAL | Status: DC
  Administered 2015-03-05 – 2015-03-08 (×9): 0.1 mg via ORAL
  Filled 2015-03-05 (×10): qty 1

## 2015-03-05 MED ORDER — CLINDAMYCIN PHOSPHATE 300 MG/50ML IV SOLN
300.0000 mg | Freq: Four times a day (QID) | INTRAVENOUS | Status: AC
  Administered 2015-03-05 – 2015-03-06 (×3): 300 mg via INTRAVENOUS
  Filled 2015-03-05 (×3): qty 50

## 2015-03-05 MED ORDER — THROMBIN 5000 UNITS EX SOLR
CUTANEOUS | Status: DC | PRN
  Administered 2015-03-05 (×2): 5000 [IU] via TOPICAL

## 2015-03-05 SURGICAL SUPPLY — 154 items
ADH SKN CLS APL DERMABOND .7 (GAUZE/BANDAGES/DRESSINGS) ×1
APL SKNCLS STERI-STRIP NONHPOA (GAUZE/BANDAGES/DRESSINGS) ×1
APL SRG 60D 8 XTD TIP BNDBL (TIP) ×1
ATTRACTOMAT 16X20 MAGNETIC DRP (DRAPES) ×2 IMPLANT
BALL CTTN LRG ABS STRL LF (GAUZE/BANDAGES/DRESSINGS)
BENZOIN TINCTURE PRP APPL 2/3 (GAUZE/BANDAGES/DRESSINGS) ×2 IMPLANT
BLADE EYE SICKLE 84 5 BEAV (BLADE) IMPLANT
BLADE ROTATE RAD 40 4 M4 (BLADE) IMPLANT
BLADE ROTATE TRICUT 4X13 M4 (BLADE) ×3 IMPLANT
BLADE SURG 10 STRL SS (BLADE) ×2 IMPLANT
BLADE SURG 11 STRL SS (BLADE) ×4 IMPLANT
BLADE SURG 15 STRL LF DISP TIS (BLADE) ×1 IMPLANT
BLADE SURG 15 STRL SS (BLADE) ×2
BUR MATCHSTICK NEURO 3.0 LAGG (BURR) IMPLANT
CANISTER SUCT 3000ML PPV (MISCELLANEOUS) ×4 IMPLANT
CATH ROBINSON RED A/P 14FR (CATHETERS) IMPLANT
COAGULATOR SUCT SWTCH 10FR 6 (ELECTROSURGICAL) ×2 IMPLANT
CORDS BIPOLAR (ELECTRODE) ×2 IMPLANT
COTTONBALL LRG STERILE PKG (GAUZE/BANDAGES/DRESSINGS) ×1 IMPLANT
COVER MAYO STAND STRL (DRAPES) ×1 IMPLANT
CRADLE DONUT ADULT HEAD (MISCELLANEOUS) IMPLANT
DECANTER SPIKE VIAL GLASS SM (MISCELLANEOUS) ×5 IMPLANT
DEPRESSOR TONGUE BLADE STERILE (MISCELLANEOUS) ×2 IMPLANT
DERMABOND ADVANCED (GAUZE/BANDAGES/DRESSINGS) ×1
DERMABOND ADVANCED .7 DNX12 (GAUZE/BANDAGES/DRESSINGS) IMPLANT
DRAIN SUBARACHNOID (WOUND CARE) IMPLANT
DRAPE C-ARM 42X72 X-RAY (DRAPES) ×2 IMPLANT
DRAPE EENT ADH APERT 15X15 STR (DRAPES) IMPLANT
DRAPE INCISE IOBAN 66X45 STRL (DRAPES) ×2 IMPLANT
DRAPE MICROSCOPE LEICA (MISCELLANEOUS) ×2 IMPLANT
DRAPE POUCH INSTRU U-SHP 10X18 (DRAPES) ×2 IMPLANT
DRAPE PROXIMA HALF (DRAPES) ×6 IMPLANT
DRESSING NASAL POPE 10X1.5X2.5 (GAUZE/BANDAGES/DRESSINGS) IMPLANT
DRSG NASAL POPE 10X1.5X2.5 (GAUZE/BANDAGES/DRESSINGS)
DRSG NASOPORE 8CM (GAUZE/BANDAGES/DRESSINGS) IMPLANT
DURAPREP 26ML APPLICATOR (WOUND CARE) ×2 IMPLANT
DURASEAL APPLICATOR TIP (TIP) ×1 IMPLANT
DURASEAL SPINE SEALANT 3ML (MISCELLANEOUS) ×1 IMPLANT
ELECT CAUTERY BLADE 6.4 (BLADE) ×3 IMPLANT
ELECT COATED BLADE 2.86 ST (ELECTRODE) ×3 IMPLANT
ELECT NDL TIP 2.8 STRL (NEEDLE) ×1 IMPLANT
ELECT NEEDLE TIP 2.8 STRL (NEEDLE) ×2 IMPLANT
ELECT REM PT RETURN 9FT ADLT (ELECTROSURGICAL) ×2
ELECTRODE REM PT RTRN 9FT ADLT (ELECTROSURGICAL) ×1 IMPLANT
FILTER ARTHROSCOPY CONVERTOR (FILTER) IMPLANT
FLOSEAL (HEMOSTASIS) IMPLANT
GAUZE PACKING FOLDED 1IN STRL (GAUZE/BANDAGES/DRESSINGS) ×2 IMPLANT
GAUZE PACKING FOLDED 2  STR (GAUZE/BANDAGES/DRESSINGS) ×1
GAUZE PACKING FOLDED 2 STR (GAUZE/BANDAGES/DRESSINGS) ×1 IMPLANT
GAUZE SPONGE 2X2 8PLY STRL LF (GAUZE/BANDAGES/DRESSINGS) ×1 IMPLANT
GAUZE SPONGE 4X4 12PLY STRL (GAUZE/BANDAGES/DRESSINGS) ×2 IMPLANT
GLOVE BIO SURGEON STRL SZ 6.5 (GLOVE) IMPLANT
GLOVE BIO SURGEON STRL SZ7 (GLOVE) IMPLANT
GLOVE BIO SURGEON STRL SZ7.5 (GLOVE) IMPLANT
GLOVE BIO SURGEON STRL SZ8 (GLOVE) IMPLANT
GLOVE BIO SURGEON STRL SZ8.5 (GLOVE) IMPLANT
GLOVE BIOGEL M 8.0 STRL (GLOVE) IMPLANT
GLOVE BIOGEL PI IND STRL 8 (GLOVE) IMPLANT
GLOVE BIOGEL PI INDICATOR 8 (GLOVE) ×1
GLOVE ECLIPSE 6.5 STRL STRAW (GLOVE) ×2 IMPLANT
GLOVE ECLIPSE 7.0 STRL STRAW (GLOVE) IMPLANT
GLOVE ECLIPSE 7.5 STRL STRAW (GLOVE) IMPLANT
GLOVE ECLIPSE 8.0 STRL XLNG CF (GLOVE) IMPLANT
GLOVE ECLIPSE 8.5 STRL (GLOVE) IMPLANT
GLOVE EXAM NITRILE LRG STRL (GLOVE) IMPLANT
GLOVE EXAM NITRILE MD LF STRL (GLOVE) IMPLANT
GLOVE EXAM NITRILE XL STR (GLOVE) IMPLANT
GLOVE EXAM NITRILE XS STR PU (GLOVE) IMPLANT
GLOVE INDICATOR 6.5 STRL GRN (GLOVE) IMPLANT
GLOVE INDICATOR 7.0 STRL GRN (GLOVE) ×3 IMPLANT
GLOVE INDICATOR 7.5 STRL GRN (GLOVE) ×4 IMPLANT
GLOVE INDICATOR 8.0 STRL GRN (GLOVE) IMPLANT
GLOVE INDICATOR 8.5 STRL (GLOVE) IMPLANT
GLOVE OPTIFIT SS 7.5 STRL LX (GLOVE) IMPLANT
GLOVE OPTIFIT SS 8.0 STRL (GLOVE) IMPLANT
GLOVE SURG SS PI 6.5 STRL IVOR (GLOVE) ×2 IMPLANT
GLOVE SURG SS PI 7.0 STRL IVOR (GLOVE) ×2 IMPLANT
GLOVE SURG SS PI 7.5 STRL IVOR (GLOVE) ×2 IMPLANT
GLOVE SURG SS PI 8.0 STRL IVOR (GLOVE) ×1 IMPLANT
GOWN STRL NON-REIN LRG LVL3 (GOWN DISPOSABLE) ×4 IMPLANT
GOWN STRL REUS W/ TWL LRG LVL3 (GOWN DISPOSABLE) ×2 IMPLANT
GOWN STRL REUS W/ TWL XL LVL3 (GOWN DISPOSABLE) IMPLANT
GOWN STRL REUS W/TWL 2XL LVL3 (GOWN DISPOSABLE) IMPLANT
GOWN STRL REUS W/TWL LRG LVL3 (GOWN DISPOSABLE) ×4
GOWN STRL REUS W/TWL XL LVL3 (GOWN DISPOSABLE) ×2
HEMOSTAT POWDER SURGIFOAM 1G (HEMOSTASIS) ×1 IMPLANT
HEMOSTAT SURGICEL 2X14 (HEMOSTASIS) ×1 IMPLANT
KIT BASIN OR (CUSTOM PROCEDURE TRAY) ×4 IMPLANT
KIT ROOM TURNOVER OR (KITS) ×4 IMPLANT
MARKER SKIN DUAL TIP RULER LAB (MISCELLANEOUS) ×3 IMPLANT
NDL HYPO 25X1 1.5 SAFETY (NEEDLE) ×1 IMPLANT
NDL PRECISIONGLIDE 27X1.5 (NEEDLE) ×1 IMPLANT
NDL SPNL 22GX3.5 QUINCKE BK (NEEDLE) ×1 IMPLANT
NDL SPNL 25GX3.5 QUINCKE BL (NEEDLE) IMPLANT
NEEDLE HYPO 25X1 1.5 SAFETY (NEEDLE) ×4 IMPLANT
NEEDLE PRECISIONGLIDE 27X1.5 (NEEDLE) ×2 IMPLANT
NEEDLE SPNL 22GX3.5 QUINCKE BK (NEEDLE) ×2 IMPLANT
NEEDLE SPNL 25GX3.5 QUINCKE BL (NEEDLE) ×2 IMPLANT
NS IRRIG 1000ML POUR BTL (IV SOLUTION) ×5 IMPLANT
PAD ARMBOARD 7.5X6 YLW CONV (MISCELLANEOUS) ×6 IMPLANT
PAD SHARPS MAGNETIC DISPOSAL (MISCELLANEOUS) ×1 IMPLANT
PATTIES SURGICAL .25X.25 (GAUZE/BANDAGES/DRESSINGS) ×3 IMPLANT
PATTIES SURGICAL .5 X.5 (GAUZE/BANDAGES/DRESSINGS) ×3 IMPLANT
PATTIES SURGICAL .5 X3 (DISPOSABLE) ×5 IMPLANT
PATTIES SURGICAL 1X1 (DISPOSABLE) ×2 IMPLANT
PENCIL BUTTON HOLSTER BLD 10FT (ELECTRODE) ×3 IMPLANT
PENCIL FOOT CONTROL (ELECTRODE) ×1 IMPLANT
RUBBERBAND STERILE (MISCELLANEOUS) ×4 IMPLANT
SHEATH ENDOSCRUB 0 DEG (SHEATH) ×1 IMPLANT
SHEATH ENDOSCRUB 30 DEG (SHEATH) IMPLANT
SHEATH ENDOSCRUB 45 DEG (SHEATH) IMPLANT
SHEET SIL 040 (INSTRUMENTS) IMPLANT
SOLUTION ANTI FOG 6CC (MISCELLANEOUS) ×2 IMPLANT
SPECIMEN JAR SMALL (MISCELLANEOUS) ×4 IMPLANT
SPLINT NASAL DOYLE BI-VL (GAUZE/BANDAGES/DRESSINGS) ×2 IMPLANT
SPONGE GAUZE 2X2 STER 10/PKG (GAUZE/BANDAGES/DRESSINGS) ×1
SPONGE LAP 4X18 X RAY DECT (DISPOSABLE) ×2 IMPLANT
SPONGE SURGIFOAM ABS GEL SZ50 (HEMOSTASIS) IMPLANT
STAPLER SKIN PROX WIDE 3.9 (STAPLE) ×2 IMPLANT
STRIP CLOSURE SKIN 1/2X4 (GAUZE/BANDAGES/DRESSINGS) ×1 IMPLANT
STRIP CLOSURE SKIN 1/4X4 (GAUZE/BANDAGES/DRESSINGS) IMPLANT
SUT BONE WAX W31G (SUTURE) ×2 IMPLANT
SUT CHROMIC 3 0 PS 2 (SUTURE) IMPLANT
SUT CHROMIC 4 0 P 3 18 (SUTURE) IMPLANT
SUT ETHILON 3 0 PS 1 (SUTURE) IMPLANT
SUT ETHILON 4 0 PS 2 18 (SUTURE) IMPLANT
SUT ETHILON 6 0 P 1 (SUTURE) IMPLANT
SUT NOVAFIL 6 0 PRE 2 4412 13 (SUTURE) IMPLANT
SUT PLAIN 4 0 ~~LOC~~ 1 (SUTURE) IMPLANT
SUT PROLENE 6 0 BV (SUTURE) IMPLANT
SUT SILK 2 0 FS (SUTURE) ×2 IMPLANT
SUT VIC AB 2-0 CT1 27 (SUTURE)
SUT VIC AB 2-0 CT1 27XBRD (SUTURE) IMPLANT
SUT VIC AB 2-0 CT2 18 VCP726D (SUTURE) ×1 IMPLANT
SUT VIC AB 3-0 SH 8-18 (SUTURE) ×1 IMPLANT
SUT VIC AB 4-0 P-3 18X BRD (SUTURE) IMPLANT
SUT VIC AB 4-0 P3 18 (SUTURE)
SWAB COLLECTION DEVICE MRSA (MISCELLANEOUS) IMPLANT
SYR 50ML SLIP (SYRINGE) IMPLANT
SYR 5ML LL (SYRINGE) ×2 IMPLANT
SYR CONTROL 10ML LL (SYRINGE) ×2 IMPLANT
SYR TB 1ML 25GX5/8 (SYRINGE) IMPLANT
SYRINGE 10CC LL (SYRINGE) ×2 IMPLANT
TOWEL OR 17X24 6PK STRL BLUE (TOWEL DISPOSABLE) ×4 IMPLANT
TOWEL OR 17X26 10 PK STRL BLUE (TOWEL DISPOSABLE) ×5 IMPLANT
TRACKER ENT INSTRUMENT (MISCELLANEOUS) ×3 IMPLANT
TRACKER ENT PATIENT (MISCELLANEOUS) ×3 IMPLANT
TRAY ENT MC OR (CUSTOM PROCEDURE TRAY) ×5 IMPLANT
TRAY FOLEY W/METER SILVER 14FR (SET/KITS/TRAYS/PACK) ×2 IMPLANT
TUBE CONNECTING 12X1/4 (SUCTIONS) ×2 IMPLANT
TUBING STRAIGHTSHOT EPS 5PK (TUBING) ×3 IMPLANT
UNDERPAD 30X30 INCONTINENT (UNDERPADS AND DIAPERS) ×2 IMPLANT
WATER STERILE IRR 1000ML POUR (IV SOLUTION) ×6 IMPLANT
WIPE INSTRUMENT VISIWIPE 73X73 (MISCELLANEOUS) ×2 IMPLANT

## 2015-03-05 NOTE — Progress Notes (Signed)
Dr. Kathyrn Sheriff notified of urine specific gravity and increased urine output. NA+ ordered for now and AM. Informed Dr. Kathyrn Sheriff about amount of drainage from nose. No new orders given. Will continue to monitor.

## 2015-03-05 NOTE — Progress Notes (Signed)
03/05/2015 8:32 PM  Todd Santana [id number]  Post-Op Check   Temp:  [97.5 F (36.4 C)-98.5 F (36.9 C)] 98.5 F (36.9 C) (02/03 1700) Pulse Rate:  [74-113] 113 (02/03 2000) Resp:  [10-20] 16 (02/03 2000) BP: (128-180)/(85-108) 132/86 mmHg (02/03 2000) SpO2:  [93 %-100 %] 97 % (02/03 2000) Weight:  [80.74 kg (178 lb)-86.2 kg (190 lb 0.6 oz)] 86.2 kg (190 lb 0.6 oz) (02/03 1700),     Intake/Output Summary (Last 24 hours) at 03/05/15 2032 Last data filed at 03/05/15 2000  Gross per 24 hour  Intake   1927 ml  Output   1175 ml  Net    752 ml    Results for orders placed or performed during the hospital encounter of 03/05/15 (from the past 24 hour(s))  Basic metabolic panel     Status: None   Collection Time: 03/05/15  8:54 AM  Result Value Ref Range   Sodium 137 135 - 145 mmol/L   Potassium 5.0 3.5 - 5.1 mmol/L   Chloride 103 101 - 111 mmol/L   CO2 26 22 - 32 mmol/L   Glucose, Bld 90 65 - 99 mg/dL   BUN 15 6 - 20 mg/dL   Creatinine, Ser 0.99 0.61 - 1.24 mg/dL   Calcium 9.1 8.9 - 10.3 mg/dL   GFR calc non Af Amer >60 >60 mL/min   GFR calc Af Amer >60 >60 mL/min   Anion gap 8 5 - 15  CBC     Status: None   Collection Time: 03/05/15  8:54 AM  Result Value Ref Range   WBC 5.5 4.0 - 10.5 K/uL   RBC 4.48 4.22 - 5.81 MIL/uL   Hemoglobin 14.3 13.0 - 17.0 g/dL   HCT 40.6 39.0 - 52.0 %   MCV 90.6 78.0 - 100.0 fL   MCH 31.9 26.0 - 34.0 pg   MCHC 35.2 30.0 - 36.0 g/dL   RDW 13.0 11.5 - 15.5 %   Platelets 185 150 - 400 K/uL    SUBJECTIVE:  Sleeping.  Arouses.    OBJECTIVE:  Large serous nasal drainage.  Vision intact OU.    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  PLAN:  Limit activity.  Jodi Marble

## 2015-03-05 NOTE — Anesthesia Preprocedure Evaluation (Addendum)
Anesthesia Evaluation  Patient identified by MRN, date of birth, ID band Patient awake    Reviewed: Allergy & Precautions, NPO status , Patient's Chart, lab work & pertinent test results  History of Anesthesia Complications Negative for: history of anesthetic complications  Airway Mallampati: I  TM Distance: >3 FB Neck ROM: Full    Dental  (+) Teeth Intact, Dental Advisory Given   Pulmonary former smoker,    breath sounds clear to auscultation       Cardiovascular negative cardio ROS   Rhythm:Regular Rate:Normal     Neuro/Psych  Neuromuscular disease    GI/Hepatic negative GI ROS, Neg liver ROS,   Endo/Other  Pituitary adenoma  Renal/GU negative Renal ROS     Musculoskeletal   Abdominal   Peds  Hematology negative hematology ROS (+)   Anesthesia Other Findings   Reproductive/Obstetrics                           Anesthesia Physical Anesthesia Plan  ASA: I  Anesthesia Plan: General   Post-op Pain Management:    Induction: Intravenous  Airway Management Planned: Oral ETT  Additional Equipment:   Intra-op Plan:   Post-operative Plan: Extubation in OR  Informed Consent: I have reviewed the patients History and Physical, chart, labs and discussed the procedure including the risks, benefits and alternatives for the proposed anesthesia with the patient or authorized representative who has indicated his/her understanding and acceptance.   Dental advisory given  Plan Discussed with: CRNA, Surgeon and Anesthesiologist  Anesthesia Plan Comments:        Anesthesia Quick Evaluation

## 2015-03-05 NOTE — Anesthesia Postprocedure Evaluation (Signed)
Anesthesia Post Note  Patient: STEFFAN KINZIE  Procedure(s) Performed: Procedure(s) (LRB): Transsphenoidal resection of pituitary tumor with Dr. Simeon Craft for approach (N/A) ENDOSCOPIC SINUS SURGERY WITH NAVIGATION (N/A)  Patient location during evaluation: PACU Anesthesia Type: General Level of consciousness: awake and alert Pain management: pain level controlled Vital Signs Assessment: post-procedure vital signs reviewed and stable Respiratory status: spontaneous breathing, nonlabored ventilation, respiratory function stable and patient connected to nasal cannula oxygen Cardiovascular status: blood pressure returned to baseline and stable Postop Assessment: no signs of nausea or vomiting Anesthetic complications: no    Last Vitals:  Filed Vitals:   03/05/15 1642 03/05/15 1700  BP: 152/85 164/97  Pulse: 89 94  Temp:  36.9 C  Resp: 10 11    Last Pain:  Filed Vitals:   03/05/15 1718  PainSc: 2                  Mykelle Cockerell,JAMES TERRILL

## 2015-03-05 NOTE — H&P (Signed)
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  He said he started to have problems with being very thirsty and urinating all the time in 12/2014.  That eventually led to a workup for possible adult-onset diabetes mellitus, but it was found instead that he had diabetes insipidus.  He was then sent to Dr. Dwyane Dee for an endocrine workup, and as part of that he underwent an MRI of the brain.  He had never had a seizure and never had headaches, never had visual field problems.  Todd Santana has not had seizures or any other type of problem.     He is 45 years of age, right-handed, and is currently a Theme park manager.      Todd Santana states that he has had a lower sex drive.  He has had problems with his energy.  He does feel fatigued.  His wife states that it frustrates her and it frustrates him that he simply does not have the energy that he used to have.  On his pituitary evaluation he had normal levels except for testosterone of which is 255, but all of his levels were in the low normal range except for prolactin which was mildly elevated, and this was certainly due to stalk effect.     REVIEW OF SYSTEMS:                        Review of systems is positive for sinus problems, change in bowel habits, memory problems, difficulty concentrating, diabetes insipidus, excessive thirst, hormonal problems.  He denies allergic, hematologic, skin, musculoskeletal, genitourinary, respiratory, cardiovascular, constitutional, eye problems.  He has no pain anywhere.     PAST MEDICAL HISTORY:                    Past medical history is otherwise excellent.     PAST SURGICAL HISTORY:                  He has had three lumbar operations, two  discectomies, and then finally a lumbar fusion that was done in 05/2014 by Dr. Lynann Bologna.      SOCIAL HISTORY:                                He does not smoke. He does drink alcohol.  He does not use illicit drugs.     ALLERGIES:                                         He has no known drug allergies.      CURRENT MEDICATIONS:                     Desmopressin three times a day;  he is not sure of the dose.     PHYSICAL EXAMINATION:                    He is 6 feet 2 inches tall, and weighs 175.8 pounds.  Temperature is 96.2 degrees Fahrenheit, blood pressure is 108/72, pulse is 76, respiratory rate is 16.      On examination  he is alert, oriented x4, and answering all questions appropriately.  Memory, language, attention span, and fund of knowledge is normal.  Speech is clear, it is also fluent.  Pupils are equal, round, and reactive to light.  Full extraocular movements, full visual fields on confrontation.  Hearing intact to voice.  Symmetrical facial movements and sensation.  Uvula elevates in the midline.  Shoulder shrug is normal.  Tongue protrudes in the midline. He has no drift.  Normal strength in both upper and lower extremities.  Negative Romberg test.  He can tandem walk, heel walk, and toe walk without difficulty.  Reflex is 2+ in the upper and lower extremities.  Normal muscle tone, bulk, and coordination.  Gait is normal.      IMAGING STUDIES:                              MRI shows a pituitary cyst which is compressing the anterior portion of the gland, and has a very thin layer of gland of the lining sagittal films, most of the gland is inferior to the cyst.  The cyst was also anterior to the stalk.  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  I cannot say I am sure about that, but either way, this is the reason for the hormonal derangement that he is dealing with, and he has significant stalk effect on his numbers.       IMPRESSION/PLAN:                             He would like to proceed because of the fatigue and other things are just not something he would like to deal with.  Although, diabetes insipidus is now controlled, he wants to proceed. I will have him be seen by my colleague, Dr. Ruby Cola, whom I work with on pituitary tumors.  Todd Santana would like to get the procedure done as soon as possible.  There are some transitions in his life that he would like to have this out of the way for.  The risks and benefits of the procedure were explained.  He understands and wishes to proceed.

## 2015-03-05 NOTE — Anesthesia Procedure Notes (Signed)
Procedure Name: Intubation Date/Time: 03/05/2015 12:38 PM Performed by: Maryland Pink Pre-anesthesia Checklist: Patient identified, Suction available, Emergency Drugs available, Patient being monitored and Timeout performed Patient Re-evaluated:Patient Re-evaluated prior to inductionOxygen Delivery Method: Circle system utilized Preoxygenation: Pre-oxygenation with 100% oxygen Ventilation: Mask ventilation without difficulty Laryngoscope Size: Mac and 3 Grade View: Grade I Tube type: Subglottic suction tube Tube size: 7.5 mm Number of attempts: 1 Airway Equipment and Method: Stylet (DL by Lorie Phenix, CRNA) Placement Confirmation: positive ETCO2,  ETT inserted through vocal cords under direct vision and breath sounds checked- equal and bilateral Secured at: 22 cm Tube secured with: Tape Dental Injury: Teeth and Oropharynx as per pre-operative assessment

## 2015-03-05 NOTE — Transfer of Care (Signed)
Immediate Anesthesia Transfer of Care Note  Patient: Todd Santana  Procedure(s) Performed: Procedure(s): Transsphenoidal resection of pituitary tumor with Dr. Simeon Craft for approach (N/A) ENDOSCOPIC SINUS SURGERY WITH NAVIGATION (N/A)  Patient Location: PACU  Anesthesia Type:General  Level of Consciousness: awake, alert , oriented and patient cooperative  Airway & Oxygen Therapy: Patient Spontanous Breathing and Patient connected to face mask oxygen  Post-op Assessment: Report given to RN, Post -op Vital signs reviewed and stable and Patient moving all extremities  Post vital signs: Reviewed and stable  Last Vitals:  Filed Vitals:   03/05/15 0825 03/05/15 1542  BP: 128/87   Pulse: 74   Temp: 36.4 C 36.8 C  Resp: 18     Complications: No apparent anesthesia complications

## 2015-03-05 NOTE — Op Note (Signed)
03/05/2015  4:30 PM  PATIENT:  Todd Santana  45 y.o. male  PRE-OPERATIVE DIAGNOSIS:  Benign Neoplasm of Pituitary gland  POST-OPERATIVE DIAGNOSIS:  Benign Neoplasm of Pituitary gland  PROCEDURE:  Procedure(s): Transsphenoidal resection of pituitary tumor with Dr. Simeon Craft for approach ENDOSCOPIC SINUS SURGERY WITH NAVIGATION Abdominal fat graft   SURGEON: Surgeon(s): Ashok Pall, MD Ruby Cola, MD  ASSISTANTS:none, co surgeons  ANESTHESIA:   general  EBL:  Total I/O In: 1700 [I.V.:1700] Out: 300 [Urine:200; Blood:100]  BLOOD ADMINISTERED:none  CELL SAVER GIVEN:none  COUNT:per nursing  DRAINS: none   SPECIMEN:  No Specimen  DICTATION: Todd Santana was taken to the operating room, intubated, and placed under a general anesthetic without difficulty. male His head was prepared and draped in a clean fashion, and the right lower quadrant was prepped and draped in a sterile manner. Dr. Simeon Craft under separate cover has dictated his approach. Once the sella was reached I was contacted and scrubbed into the case. I used the drill to create a small opening and then used Kerrison punches to enlarge the sellar opening. I coagulated the dura, then opened the dura using the stereotactic system where the cyst was located. Clear fluid was seen  Coming from the opening. I explored the cavity, and did not appreciate solid tissue. While exploring the cavity and dissecting, I obviously went into the intradural space as csf was now coming out of the dural opening. I then took a fat graft from the right lower quadrant after removing my gown, scrubbing once more and opening a sterile tray for this procedure. I took the fat graft without difficulty. I closed the wound primarily with vicryl sutures. I used dermabond for a sterile dressing. Dr. Simeon Craft used the fat graft to seal the csf leak. Dr. Simeon Craft then closed the wound. Todd Santana was extubated moving all extremities.   PLAN OF CARE: Admit to  inpatient   PATIENT DISPOSITION:  PACU - hemodynamically stable.   Delay start of Pharmacological VTE agent (>24hrs) due to surgical blood loss or risk of bleeding:  yes

## 2015-03-05 NOTE — Op Note (Signed)
03/05/2015 3:10 PM Surgeons: Ruby Cola, MD Otolaryngology [facility] Neurosurgery  Procedures Performed:  [phone] transsphenoidal resection of pituitary mass, transsphenoidal, two surgeons [phone] bilateral total ethmoidectomy [phone] bilateral sphenoidotomies with tissue removal 31267-Right right maxillary antrostomy with tissue removal [id number] image guidance 20926- abdominal fat graft and right middle turbinate grafts for repair of sellar CSF leak.  PREOPERATIVE DIAGNOSIS: 1. Pituitary mass  POSTOPERATIVE DIAGNOSIS: 1. Pituitary mass   ANESTHESIA: General endotracheal.  ESTIMATED BLOOD LOSS: less than 297mL  HISTORY OF PRESENT ILLNESS: The patient is a  45yo with 1cm right sellar/pituitary mass.  OPERATIVE FINDINGS: no injury to the lamina papyracea, orbits, or ethmoid skull base, no injury to the anterior or posterior ethmoid arteries, CSF leak from the sella repaired with abdominal fat graft and right middle turbinate mucosal graft.  PROCEDURE: The patient was brought to the operating room and placed in the supine position. After adequate endotracheal anesthesia was obtained, the abdominal skin was prepped and draped in sterile fashion. Lidocaine 1% with 1:100,000 epinephrine was injected into the region of the nasal septum, sphenopalatine arteries, greater palatine arteries, and uncinate process bilaterally. The image guidance hardware was placed on the patient's forehead and the patient was registered to the image guidance MRI scan with surface matching registration which proceeded with good accuracy and precision.  Attention then was directed toward the right side. Lidocaine 1% with 1:100,000 epinephrine was injected in the region of the anterior portion of the right middle turbinate and uncinate process.  The right middle turbinate was removed and the mucosa removed and placed in saline to be used as a mucosal graft. The right uncinate process was removed systematically  inferiorly to superiorlywith back-biting forceps and the microdebrider. Next, the right maxillary medial wall was identified and expanded with the back-biting forceps and microdebrider. The  debrider was used to widely open the right maxillary medial wall all the way up to the maxillary roof and back to the back wall of the maxillary sinus, and the natural ostium was widely opened using the debrider and back-biter.  Using the 0 degree scope and straight debrider and after identifying the right ethmoid bulla using the image guidance suction, the anterior and posterior ethmoid air cells were entered primarily and dissected with the microdebrider and 64mm Kerrison out to the lamina and up to the ethmoid roof.  The natural ostium of the right sphenoid sinus was identified using the image guidance suction and opening using the suction and the Cottle elevator. The anterior wall of the right sphenoid was then opened widely using the 63mm Kerrison up to the sphenoid roof and out to the lamina. Attention then was directed toward the left side.  I used the 0 degree scope and straight debrider to identify the left ethmoid bulla using the image guidance suction, the anterior and posterior ethmoid air cells were entered primarily and dissected with the microdebrider and 67mm Kerrison out to the lamina and up to the ethmoid roof.  The natural ostium of the left sphenoid sinus was identified using the image guidance suction and opening using the suction and the Cottle elevator. The anterior wall of the left sphenoid was then opened widely using the 1mm Kerrison up to the sphenoid roof and out to the lamina. I used the cottle elevator to deflect the pedicle for the nasoseptal flap inferiorly bilaterally to preserve these. I used the [employer], blakesleys, and the 60mm cutting drill to connect the right and left sphenoid sinuses and to remove the sphenoid  rostrum and intersphenoidsinus septum. Once the sphenoids were connected  bilaterally I removed the sphenoid mucosa to expose the sella.  At this point I held the scope for Dr. Christella Noa as he opened the sella using the drill and Kerrisons. The sellar dura was opened using the knife and the image guidance suction and currettes were used to remove the pituitary cyst. This is dictated in Dr. Lacy Duverney note. There was a CSF leak noted from the sella so an abdominal fat graft was harvested and placed into the sella and then overlying the sellar bone, and then I placed a middle turbinate mucosal graft over the fat graft and sella. This repair was then bolstered using Duraseal, thrombin-soaked gelfoam, and then nasopore.  I irrigated out the sinuses and suctioned out the nose and stomach. The patient was turned back to anesthesia and extubated and awakened after the image guidance hardware was removed.   The patient tolerated the procedure well and returned to the recovery room in stable condition.   Dr. Ruby Cola was present for the entire procedure. 03/05/2015 3:10 PM Ruby Cola, MD

## 2015-03-06 LAB — SODIUM: SODIUM: 138 mmol/L (ref 135–145)

## 2015-03-06 NOTE — Progress Notes (Signed)
Pt seen and examined. Pt had increased UO last night, and has had thin drainage from nose. Minimal HA, no visual changes.  EXAM: Temp:  [97.8 F (36.6 C)-99.9 F (37.7 C)] 97.8 F (36.6 C) (02/04 0728) Pulse Rate:  [74-113] 84 (02/04 1000) Resp:  [10-22] 15 (02/04 1000) BP: (111-180)/(67-108) 123/85 mmHg (02/04 1000) SpO2:  [93 %-100 %] 98 % (02/04 1000) Weight:  [86.2 kg (190 lb 0.6 oz)] 86.2 kg (190 lb 0.6 oz) (02/03 1700) Intake/Output      02/03 0701 - 02/04 0700 02/04 0701 - 02/05 0700   I.V. (mL/kg) 2807 (32.6) 240 (2.8)   IV Piggyback 360    Total Intake(mL/kg) 3167 (36.7) 240 (2.8)   Urine (mL/kg/hr) 3775 225 (0.8)   Blood 100    Total Output 3875 225   Net -708 +15         Awake, alert, oriented Speech fluent CN intact Thin fluid drainage from nose, also on dressing and bed sheets  LABS: Lab Results  Component Value Date   CREATININE 0.99 03/05/2015   BUN 15 03/05/2015   NA 138 03/06/2015   K 5.0 03/05/2015   CL 103 03/05/2015   CO2 26 03/05/2015   Lab Results  Component Value Date   WBC 5.5 03/05/2015   HGB 14.3 03/05/2015   HCT 40.6 03/05/2015   MCV 90.6 03/05/2015   PLT 185 03/05/2015    IMPRESSION: - 45 y.o. 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 Has CSF rhinorrhea, appears to be slowing down. - Na stable on current dose of ddAVP  PLAN: - Will cont to monitor. If he doesn't stop leaking in the next 24-48 hrs, will likely need lumbar drain placement - Cont to monitor in ICU - cont ddAVP as scheduled

## 2015-03-07 LAB — SODIUM: Sodium: 143 mmol/L (ref 135–145)

## 2015-03-07 NOTE — Progress Notes (Signed)
Pt seen and examined. No issues overnight. Pt reports no real leakage of fluid since yesterday. No other c/o.  EXAM: Temp:  [97.4 F (36.3 C)-98.2 F (36.8 C)] 97.6 F (36.4 C) (02/05 0400) Pulse Rate:  [64-95] 73 (02/05 0700) Resp:  [11-20] 16 (02/05 0700) BP: (106-133)/(64-94) 127/72 mmHg (02/05 0700) SpO2:  [96 %-99 %] 97 % (02/05 0700) Intake/Output      02/04 0701 - 02/05 0700 02/05 0701 - 02/06 0700   P.O. 360    I.V. (mL/kg) 1920 (22.3)    IV Piggyback 210    Total Intake(mL/kg) 2490 (28.9)    Urine (mL/kg/hr) 3870 (1.9) 350 (3)   Blood     Total Output 3870 350   Net -1380 -350         Awake, alert, oriented Speech fluent CN intact Good strength  LABS: Lab Results  Component Value Date   CREATININE 0.99 03/05/2015   BUN 15 03/05/2015   NA 138 03/06/2015   K 5.0 03/05/2015   CL 103 03/05/2015   CO2 26 03/05/2015   Lab Results  Component Value Date   WBC 5.5 03/05/2015   HGB 14.3 03/05/2015   HCT 40.6 03/05/2015   MCV 90.6 03/05/2015   PLT 185 03/05/2015    IMPRESSION: - 45 y.o. 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 No CSF drainage since yesterday  PLAN: - Will hold-off on placement of lumbar drain, cont to observe in ICU today - Cont standard nasal precautions post surgery - Re-check serum Na. If stable, will d/c foley.

## 2015-03-08 MED ORDER — HYDROCORTISONE 10 MG PO TABS
10.0000 mg | ORAL_TABLET | Freq: Three times a day (TID) | ORAL | Status: DC
  Administered 2015-03-08 – 2015-03-10 (×5): 10 mg via ORAL
  Filled 2015-03-08 (×7): qty 1

## 2015-03-08 MED ORDER — DESMOPRESSIN ACETATE 0.1 MG PO TABS
0.1000 mg | ORAL_TABLET | Freq: Two times a day (BID) | ORAL | Status: DC
  Administered 2015-03-09 – 2015-03-10 (×3): 0.1 mg via ORAL
  Filled 2015-03-08 (×5): qty 1

## 2015-03-08 MED ORDER — PANTOPRAZOLE SODIUM 40 MG PO TBEC
40.0000 mg | DELAYED_RELEASE_TABLET | Freq: Every day | ORAL | Status: DC
  Administered 2015-03-08 – 2015-03-10 (×3): 40 mg via ORAL
  Filled 2015-03-08 (×4): qty 1

## 2015-03-08 MED ORDER — DESMOPRESSIN ACETATE 0.2 MG PO TABS
0.2000 mg | ORAL_TABLET | Freq: Every day | ORAL | Status: DC
  Administered 2015-03-08 – 2015-03-09 (×2): 0.2 mg via ORAL
  Filled 2015-03-08 (×3): qty 1

## 2015-03-08 MED ORDER — DESMOPRESSIN ACETATE 0.2 MG PO TABS: 0.2000 mg | ORAL_TABLET | Freq: Three times a day (TID) | ORAL | Status: DC

## 2015-03-08 NOTE — Progress Notes (Addendum)
Subjective: POD#3 from transsphenoidal resection pituitary cyst and repair of intraoperative CSF leak with abdominal fat graft and right middle turbinate mucosal graft. Had some CSF rhinorrhea POD #1 that patient states stopped for about 48 hours then last night he ambulated some and he has had some more serosanguinous drainage. His drip pad doesn't appear to have a halo sign but has some serosanguinous drainage. Complains of headache but not needing much pain medication. His urine output is still fairly high but he had polyuria at baseline.  Objective: Vital signs in last 24 hours: Temp:  [97.4 F (36.3 C)-97.9 F (36.6 C)] 97.9 F (36.6 C) (02/06 1600) Pulse Rate:  [59-90] 73 (02/06 1700) Resp:  [13-26] 21 (02/06 1700) BP: (108-146)/(69-97) 124/82 mmHg (02/06 1700) SpO2:  [96 %-100 %] 100 % (02/06 1700)  EOMI, PERRLA. Nasal cavity packed with absorbable nasopore/gelfoam. Drip pad with mild serosanguinous drainage. CN 2-12 intact and symmetric.  @LABLAST2 (wbc:2,hgb:2,hct:2,plt:2)  Recent Labs  03/06/15 0405 03/07/15 0816  NA 138 143    Medications:  No current facility-administered medications on file prior to encounter.   Current Outpatient Prescriptions on File Prior to Encounter  Medication Sig Dispense Refill  . desmopressin (DDAVP) 0.1 MG tablet 1 tablet every 8 hours 90 tablet 1  . loratadine (CLARITIN) 10 MG tablet Take 10 mg by mouth daily as needed.       Assessment/Plan: S/p transsphenoidal resection pituitary mass with intraop CSF leak and fat graft repair. I will touch base with Dr. Christella Noa. It is encouraging that the drainage stopped for ~ 36 hours. Can observe with limited activity and allow the fat graft/mucosal graft to heal in vs. Lumbar drain vs. Nasoseptal flap. Can test the nasal drainage for beta-transferrin if it persists.   LOS: 3 days   Ruby Cola 03/08/2015, 5:45 PM

## 2015-03-08 NOTE — Progress Notes (Signed)
Patient ID: AHMIER CERRITOS, male   DOB: 1970-08-12, 45 y.o.   MRN: [id number] BP 124/82 mmHg  Pulse 73  Temp(Src) 97.9 F (36.6 C) (Oral)  Resp 21  Ht 6\' 2"  (1.88 m)  Wt 86.2 kg (190 lb 0.6 oz)  BMI 24.39 kg/m2  SpO2 100% Alert and oriented x 4, speech is clear and fluent Perrl, full eom Symmetric facies, Tongue and uvula midline Snuffer today looks dry. If it stays this way until tomorrow am would not recommend intervention. If there is more than a small amount of drainage, would go with the septal flap, and lumbar drainage. Increased ddavp, decreased hydrocortisone. Results for orders placed or performed during the hospital encounter of 03/05/15 (from the past 48 hour(s))  Sodium     Status: None   Collection Time: 03/07/15  8:16 AM  Result Value Ref Range   Sodium 143 135 - 145 mmol/L

## 2015-03-08 NOTE — Progress Notes (Signed)
   03/08/15 1501  Clinical Encounter Type  Visited With Patient and family together;Health care provider  Visit Type Initial  Stress Factors  Patient Stress Factors Lack of knowledge  Family Stress Factors Lack of knowledge   Chaplain checked in with patient and patient's wife to introduce spiritual care services and offer support. Patient and wife expressed being bothered about the lack of timely communication from the doctors. Chaplain services available as needed.   Jeri Lager, Chaplain 03/08/2015 3:03 PM

## 2015-03-09 ENCOUNTER — Encounter (HOSPITAL_COMMUNITY): Payer: Self-pay | Admitting: Neurosurgery

## 2015-03-09 LAB — SODIUM: SODIUM: 140 mmol/L (ref 135–145)

## 2015-03-09 LAB — POTASSIUM: Potassium: 3.8 mmol/L (ref 3.5–5.1)

## 2015-03-09 MED ORDER — LEVETIRACETAM 500 MG PO TABS
500.0000 mg | ORAL_TABLET | Freq: Two times a day (BID) | ORAL | Status: DC
Start: 2015-03-09 — End: 2015-03-10
  Administered 2015-03-09 – 2015-03-10 (×2): 500 mg via ORAL
  Filled 2015-03-09 (×2): qty 1

## 2015-03-09 NOTE — Progress Notes (Signed)
Patient ID: Todd Santana, male   DOB: [birth year], 45 y.o.   MRN: [id number] BP 128/82 mmHg  Pulse 78  Temp(Src) 97.7 F (36.5 C) (Oral)  Resp 22  Ht 6\' 2"  (1.88 m)  Wt 86.2 kg (190 lb 0.6 oz)  BMI 24.39 kg/m2  SpO2 99% Alert and oriented x 4 No drainage on snuffer Probable discharge tomorrow.  Moving all extremities well.

## 2015-03-09 NOTE — Care Management Note (Signed)
Case Management Note  Patient Details  Name: Todd Santana MRN: [id number] Date of Birth: 02-Dec-1970  Subjective/Objective:  Pt admitted on 03/05/15 s/p transsphenoidal resection of pituitary tumor.  PTA, pt independent, lives with spouse.                    Action/Plan: Will follow for discharge planning as pt progresses.    Expected Discharge Date:                  Expected Discharge Plan:  Home/Self Care  In-House Referral:     Discharge planning Services  CM Consult  Post Acute Care Choice:    Choice offered to:     DME Arranged:    DME Agency:     HH Arranged:    HH Agency:     Status of Service:  In process, will continue to follow  Medicare Important Message Given:    Date Medicare IM Given:    Medicare IM give by:    Date Additional Medicare IM Given:    Additional Medicare Important Message give by:     If discussed at [place] of Stay Meetings, dates discussed:    Additional Comments:  Reinaldo Raddle, RN, BSN  Trauma/Neuro ICU Case Manager [phone]

## 2015-03-10 MED ORDER — DESMOPRESSIN ACETATE 0.1 MG PO TABS: ORAL_TABLET | ORAL | Status: DC

## 2015-03-10 MED ORDER — ACETAMINOPHEN-CODEINE #3 300-30 MG PO TABS: 1.0000 | ORAL_TABLET | Freq: Four times a day (QID) | ORAL | Status: DC | PRN

## 2015-03-10 MED ORDER — HYDROCORTISONE 5 MG PO TABS: ORAL_TABLET | ORAL | Status: DC

## 2015-03-10 MED ORDER — HYDROCORTISONE 10 MG PO TABS: ORAL_TABLET | ORAL | Status: DC

## 2015-03-10 NOTE — Discharge Summary (Signed)
  Physician Discharge Summary  Patient ID: JAQWAN MICUCCI MRN: [id number] DOB/AGE: 02/08/1970 45 y.o.  Admit date: 03/05/2015 Discharge date: 03/10/2015  Admission Diagnoses:Pituitary Cyst  Discharge Diagnoses:  Active Problems:   Pituitary tumor [facility])   Pituitary cyst [facility])   Discharged Condition: good   Hospital Course: Mr. Mazzoli was admitted and taken to the operating room for Transphenoidal endoscopic pituitary cyst resection. He did sustain a csf leak during the case. We repaired the leak with a fat graft. Initially it appeared the leak had not been controlled, however over the last 96 hours there has been no leak. He will remain on DDAVP, and a 4 day course of hydrocortisone. His neurologic exam is normal.   Treatments: surgery: as above  Discharge Exam: Blood pressure 116/76, pulse 71, temperature 97.6 F (36.4 C), temperature source Oral, resp. rate 9, height 6\' 2"  (1.88 m), weight 86.2 kg (190 lb 0.6 oz), SpO2 99 %. General appearance: alert, cooperative, appears stated age and no distress  Disposition: 01-Home or Self Care Benign Neoplasm of Pituitary gland    Medication List    TAKE these medications        acetaminophen-codeine 300-30 MG tablet  Commonly known as:  TYLENOL #3  Take 1 tablet by mouth every 6 (six) hours as needed for moderate pain.     desmopressin 0.1 MG tablet  Commonly known as:  DDAVP  Take 1 tablet in the morning, 1 tablet in the afternoon, 2 tablets at night.     hydrocortisone 10 MG tablet  Commonly known as:  CORTEF  Take 10 mg tablet in the morning and in the afternoon.     hydrocortisone 5 MG tablet  Commonly known as:  CORTEF  Take one tablet at night.     loratadine 10 MG tablet  Commonly known as:  CLARITIN  Take 10 mg by mouth daily as needed.           Follow-up Information    Follow up with Jahira Swiss L, MD In 2 weeks.   Specialty:  Neurosurgery   Why:  call the office to make an appointment   Contact  information:   [address] [place] [address] [phone]       Signed: Winfield Cunas 03/10/2015, 10:37 AM

## 2015-03-10 NOTE — Progress Notes (Signed)
Pt discharged home with wife. D/c education done by Derrell Lolling, RN. Pt wheeled to car by CNA. Pt has d/c written instructions as well as prescriptions.

## 2015-03-25 ENCOUNTER — Other Ambulatory Visit: Payer: BLUE CROSS/BLUE SHIELD

## 2015-03-25 ENCOUNTER — Other Ambulatory Visit: Payer: Self-pay | Admitting: Endocrinology

## 2015-03-25 DIAGNOSIS — D497 Neoplasm of unspecified behavior of endocrine glands and other parts of nervous system: Secondary | ICD-10-CM

## 2015-03-26 ENCOUNTER — Other Ambulatory Visit (INDEPENDENT_AMBULATORY_CARE_PROVIDER_SITE_OTHER): Payer: BLUE CROSS/BLUE SHIELD

## 2015-03-26 DIAGNOSIS — D497 Neoplasm of unspecified behavior of endocrine glands and other parts of nervous system: Secondary | ICD-10-CM

## 2015-03-26 LAB — TESTOSTERONE: TESTOSTERONE: 249.63 ng/dL — AB (ref 300.00–890.00)

## 2015-03-26 LAB — BASIC METABOLIC PANEL
BUN: 19 mg/dL (ref 6–23)
CHLORIDE: 102 meq/L (ref 96–112)
CO2: 29 meq/L (ref 19–32)
Calcium: 9.7 mg/dL (ref 8.4–10.5)
Creatinine, Ser: 1 mg/dL (ref 0.40–1.50)
GFR: 86.44 mL/min (ref >60.00)
GLUCOSE: 82 mg/dL (ref 70–99)
POTASSIUM: 4.1 meq/L (ref 3.5–5.1)
Sodium: 137 mEq/L (ref 135–145)

## 2015-03-26 LAB — T4, FREE: Free T4: 0.79 ng/dL (ref 0.60–1.60)

## 2015-03-30 ENCOUNTER — Encounter: Payer: Self-pay | Admitting: Endocrinology

## 2015-03-30 ENCOUNTER — Ambulatory Visit (INDEPENDENT_AMBULATORY_CARE_PROVIDER_SITE_OTHER): Payer: BLUE CROSS/BLUE SHIELD | Admitting: Endocrinology

## 2015-03-30 VITALS — BP 110/72 | HR 85 | Temp 98.3°F | Resp 14 | Ht 74.0 in | Wt 178.8 lb

## 2015-03-30 DIAGNOSIS — E038 Other specified hypothyroidism: Secondary | ICD-10-CM | POA: Diagnosis not present

## 2015-03-30 DIAGNOSIS — R7989 Other specified abnormal findings of blood chemistry: Secondary | ICD-10-CM

## 2015-03-30 DIAGNOSIS — E291 Testicular hypofunction: Secondary | ICD-10-CM

## 2015-03-30 DIAGNOSIS — E232 Diabetes insipidus: Secondary | ICD-10-CM | POA: Diagnosis not present

## 2015-03-30 MED ORDER — CLOMIPHENE CITRATE 50 MG PO TABS: ORAL_TABLET | ORAL | Status: DC

## 2015-03-30 NOTE — Patient Instructions (Signed)
Ddavp 7, AM; 4 PM and 1 tab at bedtime

## 2015-03-30 NOTE — Progress Notes (Signed)
Patient ID: Todd Santana, male   DOB: 14-Jul-1970, 45 y.o.   MRN: [id number]            Chief complaint: Follow-up of diabetes insipidus  History of Present Illness:  RECENT history:  He was found to have a pituitary cyst when referred for MRI after his diabetes insipidus was diagnosed This was drained surgically on 03/05/15 Since then the patient has still continued to take DDAVP 3 times a day with 2 tablets at bedtime He does however find that if he is missing his 2 PM dosage he can go until about 5-6 PM before he starts getting thirsty and has frequent urination Also the symptoms are not as significant as before  Sodium level as follows:  Lab Results  Component Value Date   CREATININE 1.00 03/26/2015   BUN 19 03/26/2015   NA 137 03/26/2015   K 4.1 03/26/2015   CL 102 03/26/2015   CO2 29 03/26/2015    PITUITARY dysfunction:  As part of his initial evaluation he was found to have a low testosterone and he has had symptoms of fatigue and decreased libido He may have a little improvement in his fatigue and slight improvement in libido but he does not feel back to normal Testosterone level is still low  Lab Results  Component Value Date   TESTOSTERONE 249.63* 03/26/2015   TESTOSTERONE 265.25* 01/22/2015    SECONDARY hypothyroidism:  His free T4 at baseline was relatively low With surgery on his pituitary gland he is still having fatigue and good significantly tired especially in the evening although  may be slightly better recently.  Also at times gets significantly cold No significant weight change  Lab Results  Component Value Date   TSH 2.41 02/11/2013   FREET4 0.79 03/26/2015   FREET4 0.67 01/22/2015     BACKGROUND information: He started having a rapid onset of symptoms of increased thirst around November 1.   He found that he was not getting satisfied with drinking a lot of water and with continue to be thirsty day and night. Also started increasing the  amount and frequency of urination including 2-3 times at night.  Initial labs:  Serum osmolality upper normal at 296, urine osmolality 275.  Serum sodium 143 He has measured his urine volume and it is 5 L  Since urine specific gravity was also low at 1010 he was started on DDAVP 0.1 mg 3 times a day. However he only started this about 36 hours ago as it was not available at pharmacy He has not had to urinate much at all and not complaining of thirst  He thinks the medication does inhibit urination significantly  Recently not having any headaches or peripheral vision difficulties His testosterone level and free T4 low normal with normal LH  Lab Results  Component Value Date   CREATININE 1.00 03/26/2015   BUN 19 03/26/2015   NA 137 03/26/2015   K 4.1 03/26/2015   CL 102 03/26/2015   CO2 29 03/26/2015   Lab Results  Component Value Date   TSH 2.41 02/11/2013   FREET4 0.79 03/26/2015   FREET4 0.67 01/22/2015     Past Medical History  Diagnosis Date  . Lumbago 04/01/2009  . Allergy     Past Surgical History  Procedure Laterality Date  . Spine surgery      ruptured disk 2002  . Broken leg  2000  . Back surgery      x2 total    .  Vasectomy    . Craniotomy N/A 03/05/2015    Procedure: Transsphenoidal resection of pituitary tumor with Dr. Simeon Craft for approach;  Surgeon: Ashok Pall, MD;  Location: [facility];  Service: Neurosurgery;  Laterality: N/A;  . Sinus endo w/fusion N/A 03/05/2015    Procedure: ENDOSCOPIC SINUS SURGERY WITH NAVIGATION;  Surgeon: Ruby Cola, MD;  Location: [facility];  Service: ENT;  Laterality: N/A;    Family History  Problem Relation Age of Onset  . Cancer Other     grandmother, breast CA  . Healthy Mother     Social History:  reports that he has quit smoking. He has never used smokeless tobacco. He reports that he drinks alcohol. He reports that he does not use illicit drugs.  Allergies: No Known Allergies    Medication List       This  list is accurate as of: 03/30/15  2:23 PM.  Always use your most recent med list.               acetaminophen-codeine 300-30 MG tablet  Commonly known as:  TYLENOL #3  Take 1 tablet by mouth every 6 (six) hours as needed for moderate pain.     desmopressin 0.1 MG tablet  Commonly known as:  DDAVP  Take 1 tablet in the morning, 1 tablet in the afternoon, 2 tablets at night.     hydrocortisone 10 MG tablet  Commonly known as:  CORTEF  Take 10 mg tablet in the morning and in the afternoon.     hydrocortisone 5 MG tablet  Commonly known as:  CORTEF  Take one tablet at night.     loratadine 10 MG tablet  Commonly known as:  CLARITIN  Take 10 mg by mouth daily as needed.        LABS:  Lab on 03/26/2015  Component Date Value Ref Range Status  . Testosterone 03/26/2015 249.63* 300.00 - 890.00 ng/dL Final  . Free T4 03/26/2015 0.79  0.60 - 1.60 ng/dL Final  . Sodium 03/26/2015 137  135 - 145 mEq/L Final  . Potassium 03/26/2015 4.1  3.5 - 5.1 mEq/L Final  . Chloride 03/26/2015 102  96 - 112 mEq/L Final  . CO2 03/26/2015 29  19 - 32 mEq/L Final  . Glucose, Bld 03/26/2015 82  70 - 99 mg/dL Final  . BUN 03/26/2015 19  6 - 23 mg/dL Final  . Creatinine, Ser 03/26/2015 1.00  0.40 - 1.50 mg/dL Final  . Calcium 03/26/2015 9.7  8.4 - 10.5 mg/dL Final  . GFR 03/26/2015 86.44  >60.00 mL/min Final     Review of Systems   PHYSICAL EXAM:  BP 110/72 mmHg  Pulse 85  Temp(Src) 98.3 F (36.8 C)  Resp 14  Ht 6\' 2"  (1.88 m)  Wt 178 lb 12.8 oz (81.103 kg)  BMI 22.95 kg/m2  SpO2 98%   ASSESSMENT:     central diabetes insipidus.  He has had some further improvement of his diabetes insipidus as seen by his sodium coming down relatively and also is being able to go about 10 hours between the first and second doses without symptoms.  However he is still not completely symptom-free and will need to continue the treatment.   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  He still is having fatigue but not clear of the etiology.  He is having some cold sensitivity but his free T4 is improving  Hypogonadism may be causing  some of his fatigue as his level is still relatively low although checked in the early afternoon  Pituitary cyst: Has been treated surgically and he does not have any further CSF leak, to follow-up with neurosurgeon in 3 months   PLAN:    DDAVP can be tried 3 tablets a day with only one at bedtime and also tried to move up the afternoon dose at least 2 hours.  May consider tapering this down further on the next visit  Trial of clomiphene 25 mg 3 times a week to boost his endogenous testosterone production  Most likely this can be a short-term treatment at would consider testosterone supplementation of long-term  Also will continue to follow free T4 without supplementation since he is subjectively doing somewhat better  Follow-up in 1 month   Keyandra Swenson 03/30/2015, 2:23 PM

## 2015-04-19 ENCOUNTER — Other Ambulatory Visit: Payer: Self-pay | Admitting: Endocrinology

## 2015-04-19 ENCOUNTER — Other Ambulatory Visit (INDEPENDENT_AMBULATORY_CARE_PROVIDER_SITE_OTHER): Payer: BLUE CROSS/BLUE SHIELD

## 2015-04-19 DIAGNOSIS — E038 Other specified hypothyroidism: Secondary | ICD-10-CM

## 2015-04-19 DIAGNOSIS — E232 Diabetes insipidus: Secondary | ICD-10-CM | POA: Diagnosis not present

## 2015-04-19 DIAGNOSIS — D497 Neoplasm of unspecified behavior of endocrine glands and other parts of nervous system: Secondary | ICD-10-CM

## 2015-04-19 DIAGNOSIS — R7989 Other specified abnormal findings of blood chemistry: Secondary | ICD-10-CM

## 2015-04-19 LAB — BASIC METABOLIC PANEL
BUN: 16 mg/dL (ref 6–23)
CALCIUM: 9.6 mg/dL (ref 8.4–10.5)
CO2: 30 meq/L (ref 19–32)
Chloride: 102 mEq/L (ref 96–112)
Creatinine, Ser: 0.99 mg/dL (ref 0.40–1.50)
GFR: 87.42 mL/min (ref >60.00)
Glucose, Bld: 41 mg/dL — CL (ref 70–99)
POTASSIUM: 4 meq/L (ref 3.5–5.1)
SODIUM: 140 meq/L (ref 135–145)

## 2015-04-19 LAB — T4, FREE: Free T4: 0.93 ng/dL (ref 0.60–1.60)

## 2015-04-19 LAB — CORTISOL: CORTISOL PLASMA: 10 ug/dL

## 2015-04-20 LAB — TESTOSTERONE, FREE, TOTAL, SHBG
Sex Hormone Binding: 42.8 nmol/L (ref 16.5–55.9)
TESTOSTERONE: 329 ng/dL — AB (ref 348–1197)
Testosterone, Free: 18.2 pg/mL (ref 6.8–21.5)

## 2015-04-21 ENCOUNTER — Encounter: Payer: Self-pay | Admitting: Endocrinology

## 2015-04-21 ENCOUNTER — Ambulatory Visit (INDEPENDENT_AMBULATORY_CARE_PROVIDER_SITE_OTHER): Payer: BLUE CROSS/BLUE SHIELD | Admitting: Endocrinology

## 2015-04-21 VITALS — BP 108/72 | HR 81 | Temp 98.3°F | Resp 14 | Ht 74.0 in | Wt 178.6 lb

## 2015-04-21 DIAGNOSIS — E232 Diabetes insipidus: Secondary | ICD-10-CM | POA: Diagnosis not present

## 2015-04-21 DIAGNOSIS — E291 Testicular hypofunction: Secondary | ICD-10-CM | POA: Diagnosis not present

## 2015-04-21 DIAGNOSIS — R7989 Other specified abnormal findings of blood chemistry: Secondary | ICD-10-CM

## 2015-04-21 NOTE — Progress Notes (Addendum)
Patient ID: JT HAEFS, male   DOB: [birth year], 44 y.o.   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 Since 2/17 he has been able to reduce the dose to 2 tablets, morning and late evening without any interval increase in urinary frequency or thirst.   His symptoms are controlled with this dosage  Sodium level as follows:  Lab Results  Component Value Date   CREATININE 0.99 04/19/2015   BUN 16 04/19/2015   NA 140 04/19/2015   K 4.0 04/19/2015   CL 102 04/19/2015   CO2 30 04/19/2015    PITUITARY dysfunction:  As part of his initial evaluation he was found to have a low testosterone and he has had symptoms of fatigue and decreased libido Because of his level being still low he was given clomiphene but he did not want to start this  Has had improvement in his fatigue and slight improvement in libido  Testosterone level is Normal with the free testosterone level  Lab Results  Component Value Date   TESTOSTERONE 329* 04/19/2015   TESTOSTERONE 249.63* 03/26/2015   TESTOSTERONE 265.25* 01/22/2015    SECONDARY hypothyroidism:  His free T4 at baseline was relatively low With surgery on his pituitary gland he is having gradual improvement in his level He does not feel as tired now although he has difficulty assessing this because of his commuting fairly frequently now with this new job Not complaining of cold intolerance today No significant weight change  Lab Results  Component Value Date   TSH 2.41 02/11/2013   FREET4 0.93 04/19/2015   FREET4 0.79 03/26/2015   FREET4 0.67 01/22/2015    Initial history on the first consultation: He started  having a rapid onset of symptoms of increased thirst around November 1.   He found that he was not getting satisfied with drinking a lot of water and with continue to be thirsty day and night. Also started increasing the amount and frequency of urination including 2-3 times at night.  Initial labs:  Serum osmolality upper normal at 296, urine osmolality 275.  Serum sodium 143 He has measured his urine volume and it is 5 L  Since urine specific gravity was also low at 1010 he was started on DDAVP 0.1 mg 3 times a day.  CORTISOL axis:  On his lab work in nonfasting glucose was recorded as 41 and he says he had no feelings of weakness, sweating, shakiness or difficulty with concentration, he had eaten a granola bar before that Today blood sugar is normal in the office His cortisol level is normal at 10  He does not complain of any nausea, decreased appetite or lightheadedness He says his blood pressure is normally about 120/80  Lab Results  Component Value Date   CREATININE 0.99 04/19/2015   BUN 16 04/19/2015   NA 140 04/19/2015   K 4.0 04/19/2015   CL 102 04/19/2015   CO2 30 04/19/2015   Lab Results  Component Value Date   TSH 2.41 02/11/2013   FREET4 0.93 04/19/2015   FREET4 0.79 03/26/2015   FREET4 0.67 01/22/2015  Past Medical History  Diagnosis Date  . Lumbago 04/01/2009  . Allergy     Past Surgical History  Procedure Laterality Date  . Spine surgery      ruptured disk 2002  . Broken leg  2000  . Back surgery      x2 total    . Vasectomy    . Craniotomy N/A 03/05/2015    Procedure: Transsphenoidal resection of pituitary tumor with Dr. Simeon Craft for approach;  Surgeon: Ashok Pall, MD;  Location: [place] [facility];  Service: Neurosurgery;  Laterality: N/A;  . Sinus endo w/fusion N/A 03/05/2015    Procedure: ENDOSCOPIC SINUS SURGERY WITH NAVIGATION;  Surgeon: Ruby Cola, MD;  Location: [facility];  Service: ENT;  Laterality: N/A;    Family History  Problem Relation  Age of Onset  . Cancer Other     grandmother, breast CA  . Healthy Mother     Social History:  reports that he has quit smoking. He has never used smokeless tobacco. He reports that he drinks alcohol. He reports that he does not use illicit drugs.  Allergies: No Known Allergies    Medication List       This list is accurate as of: 04/21/15  2:15 PM.  Always use your most recent med list.               desmopressin 0.1 MG tablet  Commonly known as:  DDAVP  Take 1 tablet in the morning, 1 tablet in the afternoon, 2 tablets at night.     loratadine 10 MG tablet  Commonly known as:  CLARITIN  Take 10 mg by mouth daily as needed.        LABS:  Lab on 04/19/2015  Component Date Value Ref Range Status  . Sodium 04/19/2015 140  135 - 145 mEq/L Final  . Potassium 04/19/2015 4.0  3.5 - 5.1 mEq/L Final  . Chloride 04/19/2015 102  96 - 112 mEq/L Final  . CO2 04/19/2015 30  19 - 32 mEq/L Final  . Glucose, Bld 04/19/2015 41* 70 - 99 mg/dL Final  . BUN 04/19/2015 16  6 - 23 mg/dL Final  . Creatinine, Ser 04/19/2015 0.99  0.40 - 1.50 mg/dL Final  . Calcium 04/19/2015 9.6  8.4 - 10.5 mg/dL Final  . GFR 04/19/2015 87.42  >60.00 mL/min Final  . Free T4 04/19/2015 0.93  0.60 - 1.60 ng/dL Final  . Testosterone 04/19/2015 329* 348 - 1197 ng/dL Final  . Comment, Testosterone 04/19/2015 Comment   Final   Comment: Adult male reference interval is based on a population of lean males up to 44 years old.   . Testosterone, Free 04/19/2015 18.2  6.8 - 21.5 pg/mL Final  . Sex Hormone Binding 04/19/2015 42.8  16.5 - 55.9 nmol/L Final  . Cortisol, Plasma 04/19/2015 10.0   Final   AM:  4.3 - 22.4 ug/dLPM:  3.1 - 16.7 ug/dL     Review of Systems   PHYSICAL EXAM:  BP 108/72 mmHg  Pulse 81  Temp(Src) 98.3 F (36.8 C)  Resp 14  Ht 6\' 2"  (1.88 m)  Wt 178 lb 9.6 oz (81.012 kg)  BMI 22.92 kg/m2  SpO2 98%   ASSESSMENT:     Central diabetes insipidus.  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

## 2015-04-21 NOTE — Patient Instructions (Signed)
Try 1/2 daavp at [employer]

## 2015-04-22 ENCOUNTER — Ambulatory Visit: Payer: BLUE CROSS/BLUE SHIELD | Admitting: Endocrinology

## 2015-04-30 ENCOUNTER — Ambulatory Visit: Payer: BLUE CROSS/BLUE SHIELD | Admitting: Endocrinology

## 2015-06-23 ENCOUNTER — Other Ambulatory Visit (INDEPENDENT_AMBULATORY_CARE_PROVIDER_SITE_OTHER): Payer: PRIVATE HEALTH INSURANCE

## 2015-06-23 DIAGNOSIS — E232 Diabetes insipidus: Secondary | ICD-10-CM | POA: Diagnosis not present

## 2015-06-23 DIAGNOSIS — E291 Testicular hypofunction: Secondary | ICD-10-CM | POA: Diagnosis not present

## 2015-06-23 DIAGNOSIS — R7989 Other specified abnormal findings of blood chemistry: Secondary | ICD-10-CM

## 2015-06-23 LAB — BASIC METABOLIC PANEL
BUN: 18 mg/dL (ref 6–23)
CALCIUM: 9.6 mg/dL (ref 8.4–10.5)
CO2: 28 meq/L (ref 19–32)
Chloride: 104 mEq/L (ref 96–112)
Creatinine, Ser: 1.06 mg/dL (ref 0.40–1.50)
GFR: 80.73 mL/min (ref >60.00)
GLUCOSE: 86 mg/dL (ref 70–99)
Potassium: 4.3 mEq/L (ref 3.5–5.1)
SODIUM: 138 meq/L (ref 135–145)

## 2015-06-23 LAB — URINALYSIS, ROUTINE W REFLEX MICROSCOPIC
Bilirubin Urine: NEGATIVE
HGB URINE DIPSTICK: NEGATIVE
Ketones, ur: NEGATIVE
LEUKOCYTES UA: NEGATIVE
Nitrite: NEGATIVE
RBC / HPF: NONE SEEN (ref 0–?)
Specific Gravity, Urine: 1.015 (ref 1.000–1.030)
TOTAL PROTEIN, URINE-UPE24: NEGATIVE
Urine Glucose: NEGATIVE
Urobilinogen, UA: 0.2 (ref 0.0–1.0)
WBC UA: NONE SEEN (ref 0–?)
pH: 6.5 (ref 5.0–8.0)

## 2015-06-23 LAB — TESTOSTERONE: TESTOSTERONE: 420.97 ng/dL (ref 300.00–890.00)

## 2015-06-25 ENCOUNTER — Ambulatory Visit (INDEPENDENT_AMBULATORY_CARE_PROVIDER_SITE_OTHER): Payer: PRIVATE HEALTH INSURANCE | Admitting: Endocrinology

## 2015-06-25 ENCOUNTER — Other Ambulatory Visit: Payer: Self-pay | Admitting: *Deleted

## 2015-06-25 ENCOUNTER — Encounter: Payer: Self-pay | Admitting: Endocrinology

## 2015-06-25 VITALS — BP 126/84 | HR 73 | Temp 98.2°F | Resp 14 | Ht 74.0 in | Wt 174.4 lb

## 2015-06-25 DIAGNOSIS — E236 Other disorders of pituitary gland: Secondary | ICD-10-CM

## 2015-06-25 DIAGNOSIS — E232 Diabetes insipidus: Secondary | ICD-10-CM | POA: Diagnosis not present

## 2015-06-25 MED ORDER — DESMOPRESSIN ACETATE 0.1 MG PO TABS: ORAL_TABLET | ORAL | Status: DC

## 2015-06-25 NOTE — Progress Notes (Signed)
Patient ID: Todd Santana, male   DOB: 1970-02-04, 45 y.o.   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 Occasionally if he forgets the medication at bedtime he was still sleeping through the night    His symptoms are controlled with this regimen of taking it mostly as needed Urine specific gravity 1015  Sodium level as follows:  Lab Results  Component Value Date   CREATININE 1.06 06/23/2015   BUN 18 06/23/2015   NA 138 06/23/2015   K 4.3 06/23/2015   CL 104 06/23/2015   CO2 28 06/23/2015    PITUITARY dysfunction:  As part of his initial evaluation he was found to have a low testosterone and he has had symptoms of fatigue and decreased libido However he had not wanted to take any treatment for this  Has had improvement in his fatigue and  improvement in libido  He does have some fatigue but he has various other factors causing this  Testosterone level is now normal   Lab Results  Component Value Date   TESTOSTERONE 420.97 06/23/2015   TESTOSTERONE 329* 04/19/2015   TESTOSTERONE 249.63* 03/26/2015    SECONDARY hypothyroidism:  His free T4 at baseline was relatively low With surgery on his pituitary gland he is having gradual improvement in his level He does not feel as tired now although he has difficulty assessing this because of his commuting fairly frequently now with this new job Not complaining of cold intolerance today No  significant weight change  Lab Results  Component Value Date   TSH 2.41 02/11/2013   FREET4 0.93 04/19/2015   FREET4 0.79 03/26/2015   FREET4 0.67 01/22/2015    Initial history on the first consultation: He started having a rapid onset of symptoms of increased thirst around November 1.   He found that he was not getting satisfied with drinking a lot of water and with continue to be thirsty day and night. Also started increasing the amount and frequency of urination including 2-3 times at night.  Initial labs:  Serum osmolality upper normal at 296, urine osmolality 275.  Serum sodium 143 He has measured his urine volume and it is 5 L  Since urine specific gravity was also low at 1010 he was started on DDAVP 0.1 mg 3 times a day.  CORTISOL axis:  His cortisol level is normal at 10   Lab Results  Component Value Date   CREATININE 1.06 06/23/2015   BUN 18 06/23/2015   NA 138 06/23/2015   K 4.3 06/23/2015   CL 104 06/23/2015   CO2 28 06/23/2015   Lab Results  Component Value Date   TSH 2.41 02/11/2013   FREET4 0.93 04/19/2015   FREET4 0.79 03/26/2015   FREET4 0.67 01/22/2015     Past Medical History  Diagnosis Date  . Lumbago 04/01/2009  . Allergy  Past Surgical History  Procedure Laterality Date  . Spine surgery      ruptured disk 2002  . Broken leg  2000  . Back surgery      x2 total    . Vasectomy    . Craniotomy N/A 03/05/2015    Procedure: Transsphenoidal resection of pituitary tumor with Dr. Simeon Craft for approach;  Surgeon: Ashok Pall, MD;  Location: [place] [facility];  Service: Neurosurgery;  Laterality: N/A;  . Sinus endo w/fusion N/A 03/05/2015    Procedure: ENDOSCOPIC SINUS SURGERY WITH NAVIGATION;  Surgeon: Ruby Cola, MD;  Location: [facility];  Service: ENT;  Laterality: N/A;    Family History  Problem Relation Age of Onset  . Cancer Other     grandmother, breast CA  . Healthy Mother     Social History:  reports that he has quit smoking. He has  never used smokeless tobacco. He reports that he drinks alcohol. He reports that he does not use illicit drugs.  Allergies: No Known Allergies    Medication List       This list is accurate as of: 06/25/15  1:33 PM.  Always use your most recent med list.               desmopressin 0.1 MG tablet  Commonly known as:  DDAVP  Take 1 tablet in the morning, 1 tablet in the afternoon     loratadine 10 MG tablet  Commonly known as:  CLARITIN  Take 10 mg by mouth daily as needed.        LABS:  Lab on 06/23/2015  Component Date Value Ref Range Status  . Sodium 06/23/2015 138  135 - 145 mEq/L Final  . Potassium 06/23/2015 4.3  3.5 - 5.1 mEq/L Final  . Chloride 06/23/2015 104  96 - 112 mEq/L Final  . CO2 06/23/2015 28  19 - 32 mEq/L Final  . Glucose, Bld 06/23/2015 86  70 - 99 mg/dL Final  . BUN 06/23/2015 18  6 - 23 mg/dL Final  . Creatinine, Ser 06/23/2015 1.06  0.40 - 1.50 mg/dL Final  . Calcium 06/23/2015 9.6  8.4 - 10.5 mg/dL Final  . GFR 06/23/2015 80.73  >60.00 mL/min Final  . Color, Urine 06/23/2015 YELLOW  Yellow;Lt. Yellow Final  . APPearance 06/23/2015 CLEAR  Clear Final  . Specific Gravity, Urine 06/23/2015 1.015  1.000-1.030 Final  . pH 06/23/2015 6.5  5.0 - 8.0 Final  . Total Protein, Urine 06/23/2015 NEGATIVE  Negative Final  . Urine Glucose 06/23/2015 NEGATIVE  Negative Final  . Ketones, ur 06/23/2015 NEGATIVE  Negative Final  . Bilirubin Urine 06/23/2015 NEGATIVE  Negative Final  . Hgb urine dipstick 06/23/2015 NEGATIVE  Negative Final  . Urobilinogen, UA 06/23/2015 0.2  0.0 - 1.0 Final  . Leukocytes, UA 06/23/2015 NEGATIVE  Negative Final  . Nitrite 06/23/2015 NEGATIVE  Negative Final  . WBC, UA 06/23/2015 none seen  0-2/hpf Final  . RBC / HPF 06/23/2015 none seen  0-2/hpf Final  . Testosterone 06/23/2015 420.97  300.00 - 890.00 ng/dL Final     Review of Systems   PHYSICAL EXAM:  BP 126/84 mmHg  Pulse 73  Temp(Src) 98.2 F (36.8 C)  Resp 14  Ht  6\' 2"  (1.88 m)  Wt 174 lb 6.4 oz (79.107 kg)  BMI 22.38 kg/m2  SpO2 98%   ASSESSMENT:     Central diabetes insipidus.  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  Reportedly has no residual cyst or effect on pituitary gland   PLAN:    DDAVP as above, continue to taper off and use as needed  No need for testosterone nor thyroid supplementation at this time  Will also check his IGF-1 level on the next visit if complaining of fatigue  Follow-up as needed, he is planning to move to [place] now   [facility] 06/25/2015, 1:33 PM

## 2016-10-19 ENCOUNTER — Encounter: Payer: Self-pay | Admitting: Family Medicine

## 2017-07-21 IMAGING — MR MR HEAD WO/W CM
14 of 19 series · 34 of 48 positions shown · IV contrast (multihance)
Comparison: None.

CLINICAL DATA: Hypogonadism in male.  Diabetes insipidus.

EXAM:
MRI HEAD WITHOUT AND WITH CONTRAST
TECHNIQUE: Multiplanar, multiecho pulse sequences of the brain and surrounding
structures were obtained without and with intravenous contrast.
CONTRAST:  9mL MULTIHANCE GADOBENATE DIMEGLUMINE 529 MG/ML IV SOLN

[Series 2: T1 · sagittal · 5.0mm · 0.45mm/px · 1 of 19 slices shown]
[im 1/19]
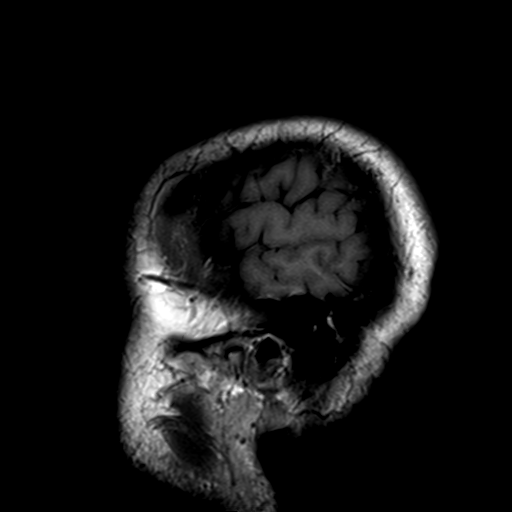

[Series 3: DWI · axial · 3.0mm · 1.80mm/px · z∈[-70,+77]mm · 11 of 100 slices shown]
[im 1/100]
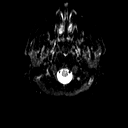
[im 10/100]
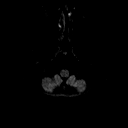
[im 20/100]
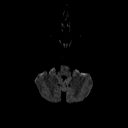
[im 30/100]
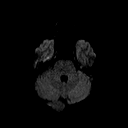
[im 40/100]
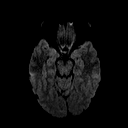
[im 50/100]
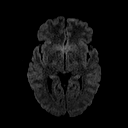
[im 60/100]
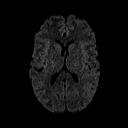
[im 70/100]
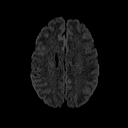
[im 80/100]
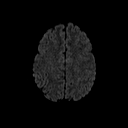
[im 90/100]
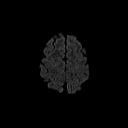
[im 100/100]
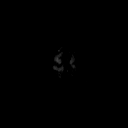

[Series 4: dwi_adc · axial · 3.0mm · 1.80mm/px · z∈[-70,+77]mm · 5 of 49 slices shown]
[im 1/49]
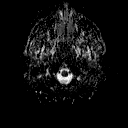
[im 13/49]
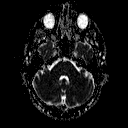
[im 25/49]
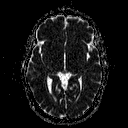
[im 37/49]
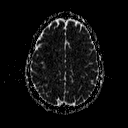
[im 49/49]
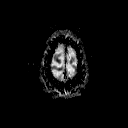

[Series 5: T2 · axial · 5.0mm · 0.36mm/px · z∈[-71,+78]mm · 3 of 24 slices shown]
[im 1/24]
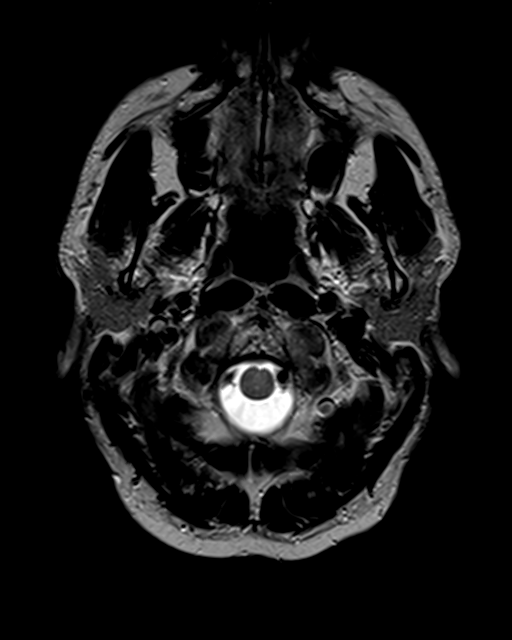
[im 12/24]
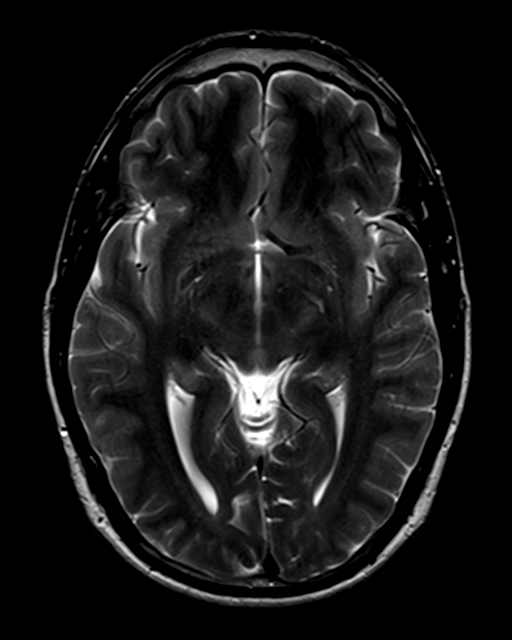
[im 24/24]
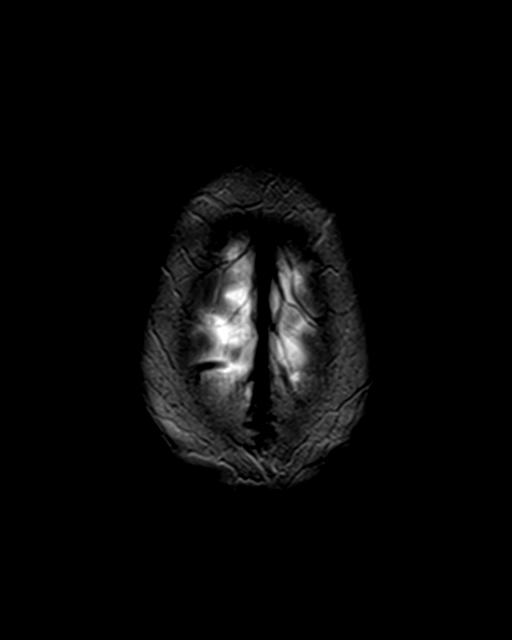

[Series 6: FLAIR · axial · 5.0mm · 0.45mm/px · z∈[-68,+74]mm · 3 of 23 slices shown]
[im 1/23]
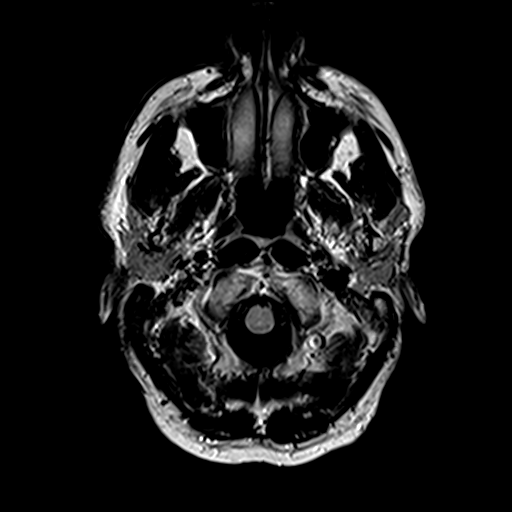
[im 12/23]
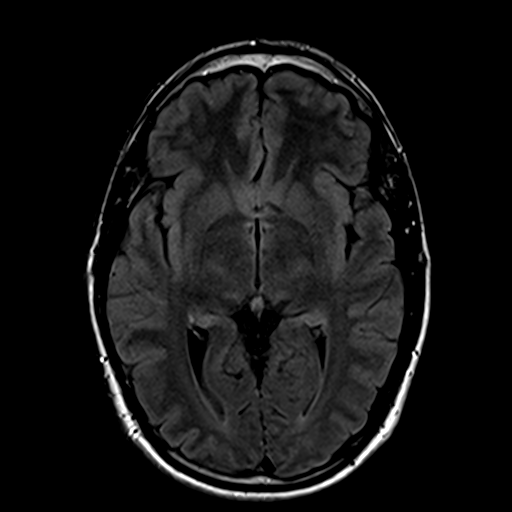
[im 23/23]
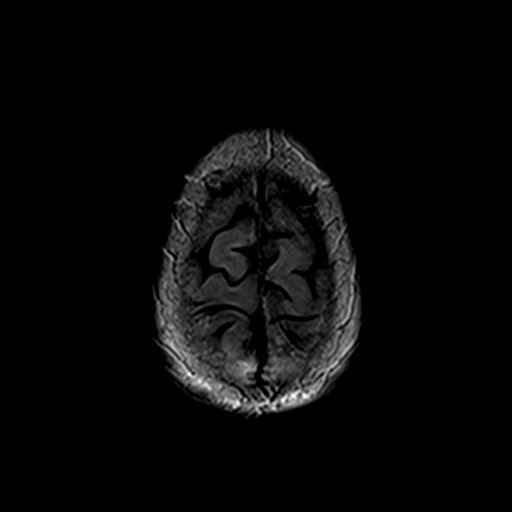

[Series 7: axial grad (blood) · axial · 5.0mm · 0.45mm/px · z∈[-71,+77]mm · 3 of 23 slices shown]
[im 1/23]
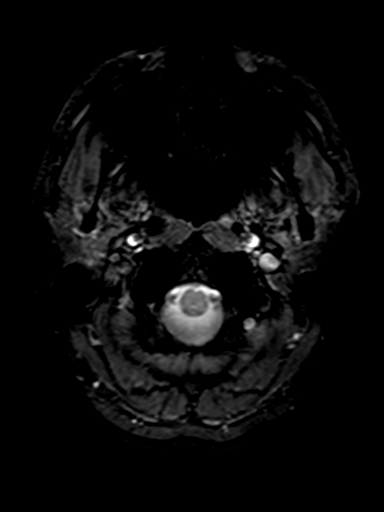
[im 12/23]
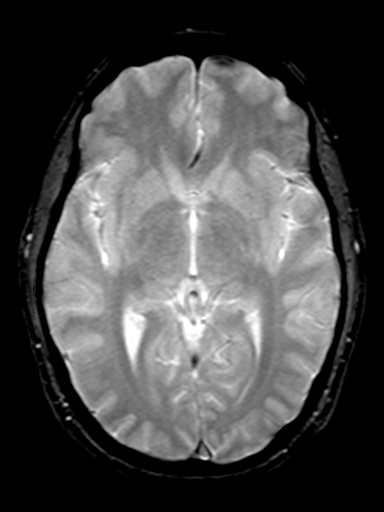
[im 23/23]
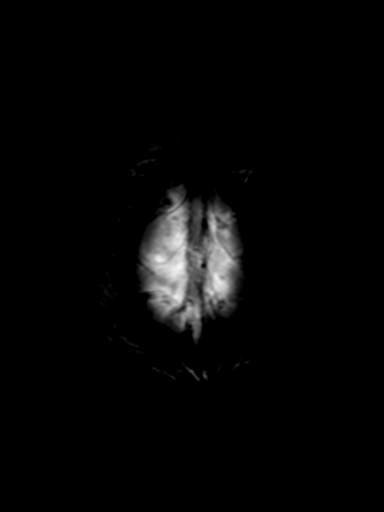

[Series 8: sag 3mm · sagittal · 3.0mm · 0.33mm/px · 1 of 11 slices shown]
[im 1/11]
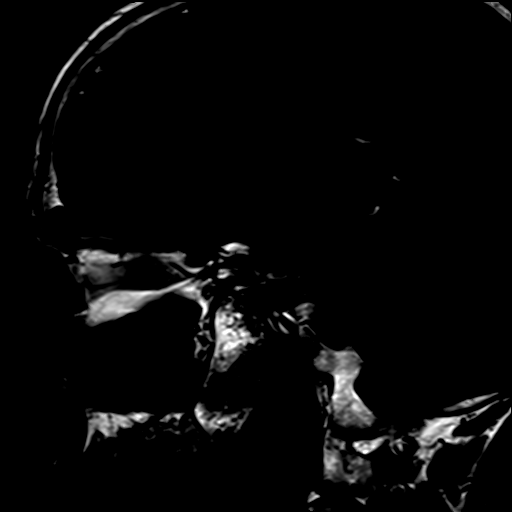

[Series 9: cor 3mm · coronal · 3.0mm · 0.33mm/px · 1 of 11 slices shown]
[im 1/11]
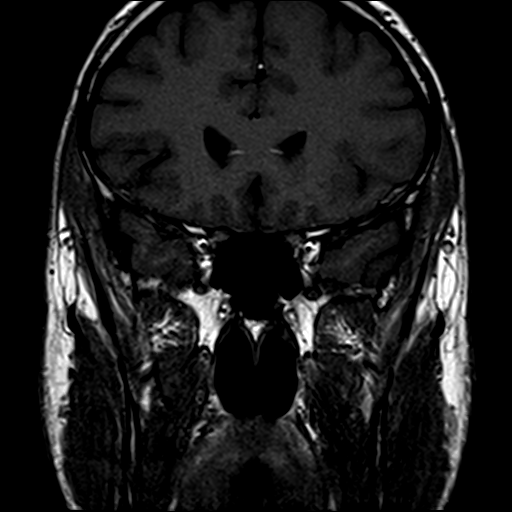

[Series 10: pre cor dynamic · coronal · non-contrast · 3.0mm · 0.35mm/px · 1 of 7 slices shown]
[im 1/7]
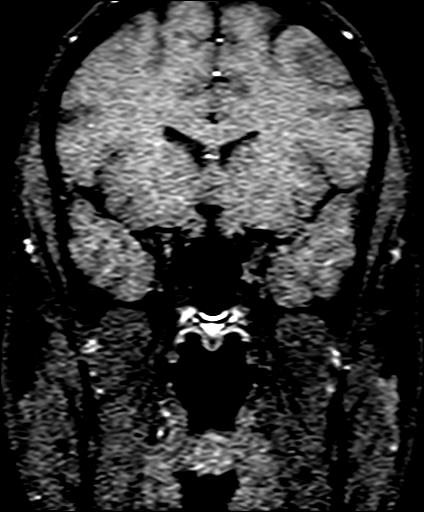

[Series 11: post fs cor · coronal · 3.0mm · 0.35mm/px · 1 of 7 slices shown (1 of 5)]
[im 1/7]
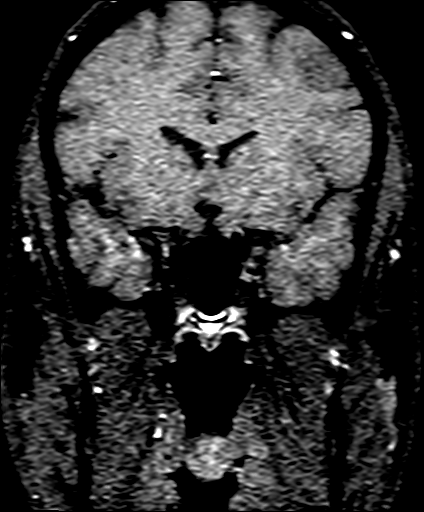

[Series 12: post fs cor · coronal · 3.0mm · 0.35mm/px · 1 of 7 slices shown (2 of 5)]
[im 1/7]
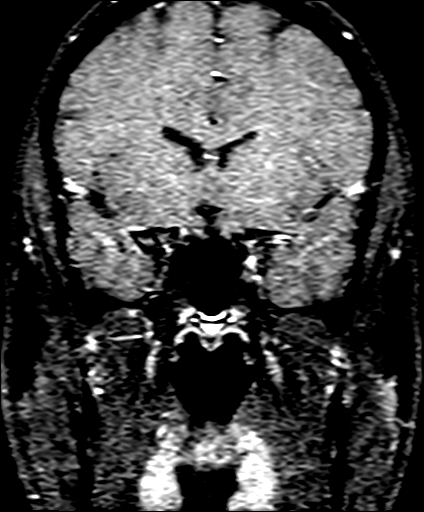

[Series 13: post fs cor · coronal · 3.0mm · 0.35mm/px · 1 of 7 slices shown (3 of 5)]
[im 1/7]
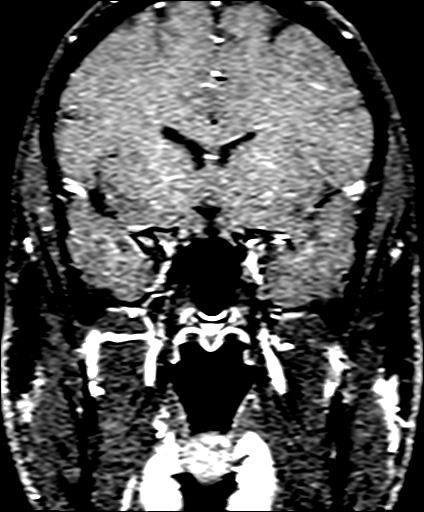

[Series 14: post fs cor · coronal · 3.0mm · 0.35mm/px · 1 of 7 slices shown (4 of 5)]
[im 1/7]
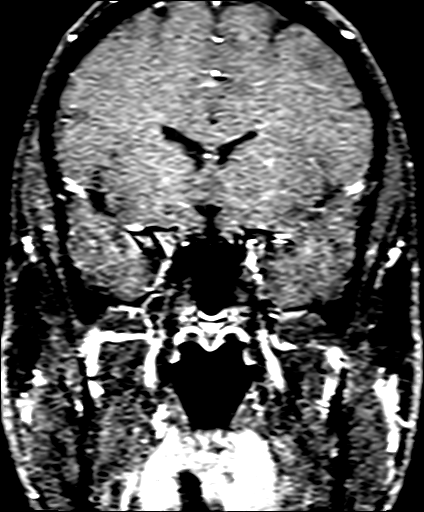

[Series 15: post fs cor · coronal · 3.0mm · 0.35mm/px · 1 of 7 slices shown (5 of 5)]
[im 1/7]
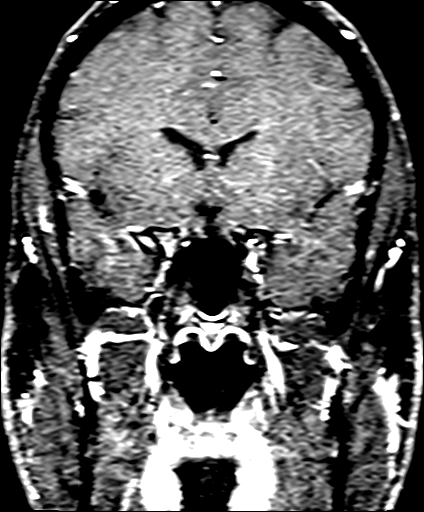

[34 of 48 positions shown; findings below may reference images not displayed]

FINDINGS: Conventional imaging of the brain demonstrates no acute infarct,
hemorrhage, or mass lesion. The ventricles are of normal size. No
significant extra-axial fluid collection is present. There is no
significant white matter disease.

The internal auditory canals are within normal limits bilaterally.
The brainstem and cerebellum are normal. Flow is present in the
major intracranial arteries. The globes and orbits are intact. The
paranasal sinuses and mastoid air cells are clear.

The pituitary gland is grossly enlarged. There is a nonenhancing
cystic lesion in the right superior aspect of the gland measuring at
10 x 9 x 8 mm. The pituitary stalk is midline. The optic chiasm is
within normal limits. There is no invasion of the cavernous sinus.

The postcontrast images demonstrate no other pathologic enhancement.

The skullbase is within normal limits. Midline sagittal images are
otherwise within normal limits.
IMPRESSION: 1. Nonenhancing T2 hyperintense lesion within the right paramedian
aspect of the pituitary gland measures 10 x 9 x 8 mm. This is most
concerning for a pituitary microadenoma. Slight signal heterogeneity
suggests this is not a Rathke's cleft cyst.
2. Otherwise normal MRI appearance of the brain.

## 2020-01-26 ENCOUNTER — Encounter (HOSPITAL_COMMUNITY): Payer: Self-pay

## 2020-01-26 ENCOUNTER — Inpatient Hospital Stay (HOSPITAL_COMMUNITY)
Admission: EM | Admit: 2020-01-26 | Discharge: 2020-01-29 | DRG: 455 | Disposition: A | Discharge status: Home / Self Care | Payer: Self-pay | Attending: Orthopedic Surgery | Admitting: Orthopedic Surgery

## 2020-01-26 ENCOUNTER — Emergency Department (HOSPITAL_COMMUNITY): Payer: Self-pay

## 2020-01-26 ENCOUNTER — Other Ambulatory Visit: Payer: Self-pay

## 2020-01-26 DIAGNOSIS — Z9852 Vasectomy status: Secondary | ICD-10-CM

## 2020-01-26 DIAGNOSIS — Z79899 Other long term (current) drug therapy: Secondary | ICD-10-CM

## 2020-01-26 DIAGNOSIS — M5116 Intervertebral disc disorders with radiculopathy, lumbar region: Principal | ICD-10-CM | POA: Diagnosis present

## 2020-01-26 DIAGNOSIS — M5442 Lumbago with sciatica, left side: Secondary | ICD-10-CM

## 2020-01-26 DIAGNOSIS — M5410 Radiculopathy, site unspecified: Secondary | ICD-10-CM | POA: Diagnosis present

## 2020-01-26 DIAGNOSIS — M5416 Radiculopathy, lumbar region: Secondary | ICD-10-CM

## 2020-01-26 DIAGNOSIS — Z981 Arthrodesis status: Secondary | ICD-10-CM

## 2020-01-26 DIAGNOSIS — Z20822 Contact with and (suspected) exposure to covid-19: Secondary | ICD-10-CM | POA: Diagnosis present

## 2020-01-26 DIAGNOSIS — Z419 Encounter for procedure for purposes other than remedying health state, unspecified: Secondary | ICD-10-CM

## 2020-01-26 DIAGNOSIS — Z87891 Personal history of nicotine dependence: Secondary | ICD-10-CM

## 2020-01-26 DIAGNOSIS — Z791 Long term (current) use of non-steroidal anti-inflammatories (NSAID): Secondary | ICD-10-CM

## 2020-01-26 LAB — CBC
HCT: 43.6 % (ref 39.0–52.0)
Hemoglobin: 15.4 g/dL (ref 13.0–17.0)
MCH: 32.4 pg (ref 26.0–34.0)
MCHC: 35.3 g/dL (ref 30.0–36.0)
MCV: 91.8 fL (ref 80.0–100.0)
Platelets: 241 10*3/uL (ref 150–400)
RBC: 4.75 MIL/uL (ref 4.22–5.81)
RDW: 12.8 % (ref 11.5–15.5)
WBC: 7.4 10*3/uL (ref 4.0–10.5)
nRBC: 0 % (ref 0.0–0.2)

## 2020-01-26 LAB — COMPREHENSIVE METABOLIC PANEL
ALT: 23 U/L (ref 0–44)
AST: 20 U/L (ref 15–41)
Albumin: 4.2 g/dL (ref 3.5–5.0)
Alkaline Phosphatase: 54 U/L (ref 38–126)
Anion gap: 8 (ref 5–15)
BUN: 23 mg/dL — ABNORMAL HIGH (ref 6–20)
CO2: 25 mmol/L (ref 22–32)
Calcium: 9.2 mg/dL (ref 8.9–10.3)
Chloride: 105 mmol/L (ref 98–111)
Creatinine, Ser: 1.16 mg/dL (ref 0.61–1.24)
GFR, Estimated: >60 mL/min (ref >60)
Glucose, Bld: 113 mg/dL — ABNORMAL HIGH (ref 70–99)
Potassium: 4.3 mmol/L (ref 3.5–5.1)
Sodium: 138 mmol/L (ref 135–145)
Total Bilirubin: 0.4 mg/dL (ref 0.3–1.2)
Total Protein: 7.2 g/dL (ref 6.5–8.1)

## 2020-01-26 LAB — RESP PANEL BY RT-PCR (FLU A&B, COVID) ARPGX2
Influenza A by PCR: NEGATIVE
Influenza B by PCR: NEGATIVE
SARS Coronavirus 2 by RT PCR: NEGATIVE

## 2020-01-26 MED ORDER — OXYCODONE-ACETAMINOPHEN 7.5-325 MG PO TABS: 1.0000 | ORAL_TABLET | ORAL | Status: DC | PRN

## 2020-01-26 MED ORDER — ONDANSETRON HCL 4 MG PO TABS
4.0000 mg | ORAL_TABLET | Freq: Three times a day (TID) | ORAL | Status: DC | PRN
  Administered 2020-01-26: 20:00:00 4 mg via ORAL
  Filled 2020-01-26: qty 1

## 2020-01-26 MED ORDER — OXYCODONE HCL 5 MG PO TABS
5.0000 mg | ORAL_TABLET | ORAL | Status: DC | PRN
  Administered 2020-01-26 (×2): 5 mg via ORAL
  Filled 2020-01-26 (×2): qty 1

## 2020-01-26 MED ORDER — CYCLOBENZAPRINE HCL 10 MG PO TABS: 5.0000 mg | ORAL_TABLET | Freq: Three times a day (TID) | ORAL | Status: DC | PRN

## 2020-01-26 MED ORDER — OXYCODONE-ACETAMINOPHEN 5-325 MG PO TABS
1.0000 | ORAL_TABLET | ORAL | Status: DC | PRN
  Administered 2020-01-26 (×2): 1 via ORAL
  Filled 2020-01-26 (×2): qty 1

## 2020-01-26 MED ORDER — GABAPENTIN 300 MG PO CAPS
300.0000 mg | ORAL_CAPSULE | Freq: Every day | ORAL | Status: DC
  Administered 2020-01-26: 22:00:00 300 mg via ORAL
  Filled 2020-01-26: qty 1

## 2020-01-26 NOTE — ED Triage Notes (Addendum)
Pt reports he is here today due to back pain. Pt reports he has a rupture disk and can no longer stand the pain.Pt reports his ortho surgeon told him to come in to possibly move the surgery up. Ortho MD would like to [facility] cell [phone] .

## 2020-01-26 NOTE — ED Notes (Signed)
Patient transported to MRI 

## 2020-01-26 NOTE — ED Notes (Signed)
Ortho MD would like to [facility] cell [phone] .

## 2020-01-26 NOTE — Consult Note (Signed)
Reason for Consult:Left leg pain Referring PA: Blaise Grieshaber is an 49 y.o. male who did present to the emergency department today with severe left leg pain.  HPI: Of note, I did have a telephone communication with Daniela this morning.  I also spoke with him last week.  At the time of my conversation with him last week, he was having significant pain in his left leg.  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  I did plan on reevaluating him in the office to discuss a plan regarding this today, however, he then Reached outthis morning, stating that his pain came unbearable yesterday.  He now having significant difficulty simply walking, and getting to the bathroom.  He states that his pain is miserable, and that he is unable to sleep, and that it is now the worst it has ever been, even worse than it was prior to his surgery 5 years ago.  He was very clear in stating his pain became much more significant yesterday.  I let him know that we do need to proceed with an updated MRI as soon as possible, and that this can either be done on an elective basis, or alternatively, I also let him know that if he feels he is unable to safely get around his house and use the bathroom, he can also present to the emergency department, particularly if he feels that his pain is unbearable and that he is not able to safely take care of himself at home. He did elect to present to the emergency department earlier today, after which point the PA that evaluated him did contact me.  At this point, Jameire does state that his left leg pain is constant and severe, involving the posterior lateral thigh and lateral leg.  It has improved somewhat after taking Percocet, but he continues to feel very uncomfortable.  Past Medical History:  Diagnosis Date  . Allergy   . Lumbago 04/01/2009    Past Surgical History:  Procedure  Laterality Date  . BACK SURGERY     x2 total    . broken leg  2000  . CRANIOTOMY N/A 03/05/2015   Procedure: Transsphenoidal resection of pituitary tumor with Dr. Emeline Darling for approach;  Surgeon: Coletta Memos, MD;  Location: [facility];  Service: Neurosurgery;  Laterality: N/A;  . SINUS ENDO W/FUSION N/A 03/05/2015   Procedure: ENDOSCOPIC SINUS SURGERY WITH NAVIGATION;  Surgeon: Melvenia Beam, MD;  Location: [facility];  Service: ENT;  Laterality: N/A;  . SPINE SURGERY     ruptured disk 2002  . VASECTOMY      Family History  Problem Relation Age of Onset  . Healthy Mother   . Cancer Other        grandmother, breast CA    Social History:  reports that he has quit smoking. He has never used smokeless tobacco. He reports current alcohol use. He reports that he does not use drugs.  Allergies: No Known Allergies  Medications: I have reviewed the patient's current medications.  Results for orders placed or performed during the hospital encounter of 01/26/20 (from the past 48 hour(s))  CBC     Status: None   Collection Time: 01/26/20  2:00 PM  Result Value Ref Range   WBC 7.4 4.0 - 10.5 K/uL   RBC 4.75 4.22 - 5.81 MIL/uL   Hemoglobin 15.4 13.0 -  17.0 g/dL   HCT 26.9 48.5 - 46.2 %   MCV 91.8 80.0 - 100.0 fL   MCH 32.4 26.0 - 34.0 pg   MCHC 35.3 30.0 - 36.0 g/dL   RDW 70.3 50.0 - 93.8 %   Platelets 241 150 - 400 K/uL   nRBC 0.0 0.0 - 0.2 %    Comment: Performed at [facility], [address] [place], [place] [address]  Comprehensive metabolic panel     Status: Abnormal   Collection Time: 01/26/20  2:00 PM  Result Value Ref Range   Sodium 138 135 - 145 mmol/L   Potassium 4.3 3.5 - 5.1 mmol/L   Chloride 105 98 - 111 mmol/L   CO2 25 22 - 32 mmol/L   Glucose, Bld 113 (H) 70 - 99 mg/dL    Comment: Glucose reference range applies only to samples taken after fasting for at least 8 hours.   BUN 23 (H) 6 - 20 mg/dL   Creatinine, Ser 3.71 0.61 - 1.24 mg/dL   Calcium 9.2 8.9 -  69.6 mg/dL   Total Protein 7.2 6.5 - 8.1 g/dL   Albumin 4.2 3.5 - 5.0 g/dL   AST 20 15 - 41 U/L   ALT 23 0 - 44 U/L   Alkaline Phosphatase 54 38 - 126 U/L   Total Bilirubin 0.4 0.3 - 1.2 mg/dL   GFR, Estimated >78 >93 mL/min    Comment: (NOTE) Calculated using the CKD-EPI Creatinine Equation (2021)    Anion gap 8 5 - 15    Comment: Performed at [facility], [address], [place], [place] [address]    No results found.  Review of Systems Blood pressure (!) 137/93, pulse 72, temperature 98.1 F (36.7 C), temperature source Oral, resp. rate 16, SpO2 100 %. Physical Exam Vitals reviewed.  HENT:     Head: Normocephalic and atraumatic.  Eyes:     Pupils: Pupils are equal, round, and reactive to light.  Musculoskeletal:        General: Normal range of motion.     Cervical back: Normal range of motion and neck supple.  Skin:    General: Skin is warm.  Neurological:     Mental Status: He is alert.     Comments: Patient is noted to have a positive straight leg raise on the left, reproducing his left leg pain.  Psychiatric:     Comments: Patient is clearly uncomfortable.     Assessment/Plan: Given the patient's significant increase in pain, this clearly is a change compared to his pain from 1 week ago.  I do feel that the size of his previously noted disc herniation likely has increased. I do feel that he is currently suffering from increasing left L5 radiculopathy. We do need to proceed with an updated MRI.  We did talk about his level of pain where he feels that he is able to safely and properly care for himself at home.  He does not feel that he currently can.  Therefore, he will be admitted for pain control.  Once his updated MRI has been reviewed, I will discuss its results with the patient, and we will proceed with an updated treatment plan depending on the results of his MRI.  Given the severity of his pain, I do suspect this may require revision surgery at the L4-L5  level, but this will ultimately depend on how well we can get his pain controlled, and the results of his updated  MRI.  He is very much on board with this plan.  Joss Friedel L Mattie Novosel 01/26/2020, 3:46 PM

## 2020-01-26 NOTE — ED Provider Notes (Signed)
[facility]ENC[facility] DEPARTMENT Provider Note   CSN: [id number] Arrival date & time: 01/26/20  1235     History Chief Complaint  Patient presents with  . Back Pain    Todd Santana is a 49 y.o. male with a past medical history significant for a pituitary tumor who presents to the ED due to worsening left-sided low back pain that radiates down posterior aspect of left lower extremity. Chart reviewed. 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 Postoperative changes with instrumented fusion at L5-S1. Patient has a scheduled surgery with Dr. Lynann Bologna at the end of the month; however, he presents to the ED due to worsening pain associated with numbness/tingling and subjective weakness.  Patient denies saddle paresthesias, bowel/bladder incontinence, fever/chills.  Patient's been taking oxycodone with moderate relief.  Patient notes he called his orthopedic surgeon who told him to report to the ED for further evaluation.  Pain is worse with ambulation.   History obtained from patient and past medical records. No interpreter used during encounter.      Past Medical History:  Diagnosis Date  . Allergy   . Lumbago 04/01/2009    Patient Active Problem List   Diagnosis Date Noted  . 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 Allergic rhinitis 01/19/2015  . Radiculopathy 05/20/2014  . LUMBAGO 04/01/2009    Past Surgical History:  Procedure Laterality Date  . BACK SURGERY     x2 total    . broken leg  2000  . CRANIOTOMY N/A 03/05/2015   Procedure: Transsphenoidal resection of pituitary tumor with Dr. Simeon Craft for approach;  Surgeon: Ashok Pall, MD;  Location: [facility];  Service: Neurosurgery;  Laterality: N/A;  . SINUS ENDO W/FUSION N/A 03/05/2015   Procedure: ENDOSCOPIC SINUS SURGERY WITH NAVIGATION;   Surgeon: Ruby Cola, MD;  Location: [facility];  Service: ENT;  Laterality: N/A;  . SPINE SURGERY     ruptured disk 2002  . VASECTOMY         Family History  Problem Relation Age of Onset  . Healthy Mother   . Cancer Other        grandmother, breast CA    Social History   Tobacco Use  . Smoking status: Former Research scientist (life sciences)  . Smokeless tobacco: Never Used  Substance Use Topics  . Alcohol use: Yes    Comment: occ  . Drug use: No    Home Medications Prior to Admission medications   Medication Sig Start Date End Date Taking? Authorizing Provider  baclofen (LIORESAL) 10 MG tablet Take 10 mg by mouth at bedtime. 01/14/20  Yes [provider]  ibuprofen (ADVIL) 200 MG tablet Take 600 mg by mouth every 6 (six) hours as needed (pain).   Yes [provider]  loratadine (CLARITIN) 10 MG tablet Take 10 mg by mouth daily as needed (seasonal allergies).   Yes [provider]  meloxicam (MOBIC) 15 MG tablet Take 15 mg by mouth daily. 01/14/20  Yes [provider]  oxyCODONE-acetaminophen (PERCOCET) 10-325 MG tablet Take 2 tablets by mouth every 4 (four) hours as needed for pain.   Yes [provider]    Allergies    Patient has no known allergies.  Review of Systems   Review of Systems  Constitutional: Negative for chills and fever.  Musculoskeletal: Positive for back pain  and gait problem.  Neurological: Positive for weakness (subjective LLE weakness) and numbness.  All other systems reviewed and are negative.   Physical Exam Updated Vital Signs BP (!) 137/93 (BP Location: Right Arm)   Pulse 72   Temp 98.1 F (36.7 C) (Oral)   Resp 16   SpO2 100%   Physical Exam Vitals and nursing note reviewed.  Constitutional:      General: He is not in acute distress.    Appearance: He is not ill-appearing.  HENT:     Head: Normocephalic.  Eyes:     Pupils: Pupils are equal, round, and reactive to light.  Neck:     Comments: No  cervical midline tenderness. Cardiovascular:     Rate and Rhythm: Normal rate and regular rhythm.     Pulses: Normal pulses.     Heart sounds: Normal heart sounds. No murmur heard. No friction rub. No gallop.   Pulmonary:     Effort: Pulmonary effort is normal.     Breath sounds: Normal breath sounds.  Abdominal:     General: Abdomen is flat. There is no distension.     Palpations: Abdomen is soft.     Tenderness: There is no abdominal tenderness. There is no guarding or rebound.  Musculoskeletal:     Cervical back: Neck supple.     Comments: No T-spine and L-spine midline tenderness, no stepoff or deformity, mild tenderness in left buttocks region. No leg edema bilaterally Patient moves all extremities without difficulty. DP/PT pulses 2+ and equal bilaterally Sensation grossly intact bilaterally Strength of knee flexion and extension is 5/5 Plantar and dorsiflexion of ankle 5/5  Skin:    General: Skin is warm and dry.  Neurological:     General: No focal deficit present.     Mental Status: He is alert.  Psychiatric:        Mood and Affect: Mood normal.        Behavior: Behavior normal.     ED Results / Procedures / Treatments   Labs (all labs ordered are listed, but only abnormal results are displayed) Labs Reviewed  COMPREHENSIVE METABOLIC PANEL - Abnormal; Notable for the following components:      Result Value   Glucose, Bld 113 (*)    BUN 23 (*)    All other components within normal limits  RESP PANEL BY RT-PCR (FLU A&B, COVID) ARPGX2  CBC    EKG None  Radiology No results found.  Procedures Procedures (including critical care time)  Medications Ordered in ED Medications  oxyCODONE-acetaminophen (PERCOCET/ROXICET) 5-325 MG per tablet 1 tablet (1 tablet Oral Given 01/26/20 1732)    And  oxyCODONE (Oxy IR/ROXICODONE) immediate release tablet 5 mg (5 mg Oral Given 01/26/20 1732)    ED Course  I have reviewed the triage vital signs and the nursing  notes.  Pertinent labs & imaging results that were available during my care of the patient were reviewed by me and considered in my medical decision making (see chart for details).    MDM Rules/Calculators/A&P                         49 year old male presents the ED due to worsening low back pain associated with subjective left lower extremity weakness/numbness and tingling. Chart reviewed. 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 Postoperative changes  with instrumented fusion at L5-S1. Patient has a scheduled surgery with Dr. Yevette Edwards with orthopedics at the end of the month. Upon arrival, Stable vitals.  Patient in no acute distress and nontoxic-appearing.  Physical exam reassuring. Bilateral lower extremities neurovascularly intact. Mild reproducible tenderness in left buttocks region. No cervical, thoracic, or lumbar midline tenderness. Discussed case with Dr. Yevette Edwards with orthopedics who recommends MRI lumbar spine and admission for pain management and possibly surgery. Orthopedics will admit. COVID test ordered.  Final Clinical Impression(s) / ED Diagnoses Final diagnoses:  Acute left-sided low back pain with left-sided sciatica    Rx / DC Orders ED Discharge Orders    None       Mannie Stabile, PA-C 01/26/20 1800    Pricilla Loveless, MD 01/28/20 [phone]

## 2020-01-27 ENCOUNTER — Inpatient Hospital Stay (HOSPITAL_COMMUNITY): Payer: Self-pay

## 2020-01-27 ENCOUNTER — Encounter (HOSPITAL_COMMUNITY)
Admission: EM | Disposition: A | Discharge status: Home / Self Care | Payer: Self-pay | Source: Home / Self Care | Attending: Orthopedic Surgery

## 2020-01-27 ENCOUNTER — Inpatient Hospital Stay (HOSPITAL_COMMUNITY): Payer: Self-pay | Admitting: Certified Registered Nurse Anesthetist

## 2020-01-27 ENCOUNTER — Encounter (HOSPITAL_COMMUNITY): Payer: Self-pay | Admitting: Orthopedic Surgery

## 2020-01-27 HISTORY — PX: TRANSFORAMINAL LUMBAR INTERBODY FUSION (TLIF) WITH PEDICLE SCREW FIXATION 4 LEVEL: SHX6144

## 2020-01-27 LAB — TYPE AND SCREEN
ABO/RH(D): A POS
Antibody Screen: NEGATIVE

## 2020-01-27 LAB — SURGICAL PCR SCREEN
MRSA, PCR: NEGATIVE
Staphylococcus aureus: NEGATIVE

## 2020-01-27 SURGERY — TRANSFORAMINAL LUMBAR INTERBODY FUSION (TLIF) WITH PEDICLE SCREW FIXATION 4 LEVEL
Anesthesia: General | Site: Back | Laterality: Left

## 2020-01-27 MED ORDER — PHENYLEPHRINE HCL-NACL 10-0.9 MG/250ML-% IV SOLN
INTRAVENOUS | Status: DC | PRN
  Administered 2020-01-27: 50 ug/min via INTRAVENOUS

## 2020-01-27 MED ORDER — ALBUMIN HUMAN 5 % IV SOLN: INTRAVENOUS | Status: DC | PRN

## 2020-01-27 MED ORDER — HYDROMORPHONE HCL 1 MG/ML IJ SOLN
INTRAMUSCULAR | Status: AC
  Filled 2020-01-27: qty 0.5

## 2020-01-27 MED ORDER — MORPHINE SULFATE (PF) 2 MG/ML IV SOLN
2.0000 mg | INTRAVENOUS | Status: DC | PRN
  Administered 2020-01-27 (×3): 2 mg via INTRAVENOUS
  Filled 2020-01-27 (×3): qty 1

## 2020-01-27 MED ORDER — SODIUM CHLORIDE 0.9 % IV SOLN: 250.0000 mL | INTRAVENOUS | Status: DC

## 2020-01-27 MED ORDER — DIPHENHYDRAMINE HCL 50 MG/ML IJ SOLN
INTRAMUSCULAR | Status: DC | PRN
  Administered 2020-01-27: 12.5 mg via INTRAVENOUS

## 2020-01-27 MED ORDER — PHENOL 1.4 % MT LIQD: 1.0000 | OROMUCOSAL | Status: DC | PRN

## 2020-01-27 MED ORDER — ONDANSETRON HCL 4 MG/2ML IJ SOLN
INTRAMUSCULAR | Status: AC
  Filled 2020-01-27: qty 4

## 2020-01-27 MED ORDER — MIDAZOLAM HCL 2 MG/2ML IJ SOLN
INTRAMUSCULAR | Status: AC
  Filled 2020-01-27: qty 2

## 2020-01-27 MED ORDER — SENNOSIDES-DOCUSATE SODIUM 8.6-50 MG PO TABS: 1.0000 | ORAL_TABLET | Freq: Every evening | ORAL | Status: DC | PRN

## 2020-01-27 MED ORDER — PROPOFOL 10 MG/ML IV BOLUS
INTRAVENOUS | Status: AC
  Filled 2020-01-27: qty 20

## 2020-01-27 MED ORDER — OXYCODONE-ACETAMINOPHEN 5-325 MG PO TABS: 1.0000 | ORAL_TABLET | Freq: Four times a day (QID) | ORAL | Status: DC | PRN

## 2020-01-27 MED ORDER — ROCURONIUM BROMIDE 10 MG/ML (PF) SYRINGE
PREFILLED_SYRINGE | INTRAVENOUS | Status: DC | PRN
  Administered 2020-01-27: 20 mg via INTRAVENOUS
  Administered 2020-01-27: 80 mg via INTRAVENOUS
  Administered 2020-01-27: 20 mg via INTRAVENOUS

## 2020-01-27 MED ORDER — CHLORHEXIDINE GLUCONATE 0.12 % MT SOLN
OROMUCOSAL | Status: AC
  Administered 2020-01-27: 15:00:00 15 mL via OROMUCOSAL
  Filled 2020-01-27: qty 15

## 2020-01-27 MED ORDER — SODIUM CHLORIDE 0.9% FLUSH: 3.0000 mL | INTRAVENOUS | Status: DC | PRN

## 2020-01-27 MED ORDER — 0.9 % SODIUM CHLORIDE (POUR BTL) OPTIME
TOPICAL | Status: DC | PRN
  Administered 2020-01-27 (×2): 1000 mL

## 2020-01-27 MED ORDER — MENTHOL 3 MG MT LOZG: 1.0000 | LOZENGE | OROMUCOSAL | Status: DC | PRN

## 2020-01-27 MED ORDER — ACETAMINOPHEN 500 MG PO TABS
1000.0000 mg | ORAL_TABLET | Freq: Once | ORAL | Status: AC
  Filled 2020-01-27: qty 2

## 2020-01-27 MED ORDER — ROCURONIUM BROMIDE 10 MG/ML (PF) SYRINGE
PREFILLED_SYRINGE | INTRAVENOUS | Status: AC
  Filled 2020-01-27: qty 20

## 2020-01-27 MED ORDER — OXYCODONE HCL 5 MG PO TABS: 5.0000 mg | ORAL_TABLET | Freq: Four times a day (QID) | ORAL | Status: DC | PRN

## 2020-01-27 MED ORDER — BUPIVACAINE LIPOSOME 1.3 % IJ SUSP
20.0000 mL | INTRAMUSCULAR | Status: AC
  Administered 2020-01-27: 21:00:00 20 mL
  Filled 2020-01-27 (×2): qty 20

## 2020-01-27 MED ORDER — CEFAZOLIN SODIUM-DEXTROSE 2-3 GM-%(50ML) IV SOLR
INTRAVENOUS | Status: DC | PRN
  Administered 2020-01-27 (×2): 2 g via INTRAVENOUS

## 2020-01-27 MED ORDER — SUCCINYLCHOLINE CHLORIDE 200 MG/10ML IV SOSY
PREFILLED_SYRINGE | INTRAVENOUS | Status: AC
  Filled 2020-01-27: qty 10

## 2020-01-27 MED ORDER — SODIUM CHLORIDE 0.9% FLUSH
3.0000 mL | Freq: Two times a day (BID) | INTRAVENOUS | Status: DC
  Administered 2020-01-27: 23:00:00 3 mL via INTRAVENOUS

## 2020-01-27 MED ORDER — DIPHENHYDRAMINE HCL 50 MG/ML IJ SOLN
INTRAMUSCULAR | Status: AC
  Filled 2020-01-27: qty 1

## 2020-01-27 MED ORDER — ONDANSETRON HCL 4 MG/2ML IJ SOLN
4.0000 mg | Freq: Three times a day (TID) | INTRAMUSCULAR | Status: DC | PRN
  Administered 2020-01-27: 13:00:00 4 mg via INTRAVENOUS
  Filled 2020-01-27: qty 2

## 2020-01-27 MED ORDER — OXYCODONE-ACETAMINOPHEN 10-325 MG PO TABS: 1.0000 | ORAL_TABLET | Freq: Four times a day (QID) | ORAL | Status: DC | PRN

## 2020-01-27 MED ORDER — GLYCOPYRROLATE PF 0.2 MG/ML IJ SOSY
PREFILLED_SYRINGE | INTRAMUSCULAR | Status: AC
  Filled 2020-01-27: qty 1

## 2020-01-27 MED ORDER — FENTANYL CITRATE (PF) 250 MCG/5ML IJ SOLN
INTRAMUSCULAR | Status: DC | PRN
  Administered 2020-01-27: 50 ug via INTRAVENOUS
  Administered 2020-01-27: 100 ug via INTRAVENOUS
  Administered 2020-01-27 (×2): 50 ug via INTRAVENOUS

## 2020-01-27 MED ORDER — LIDOCAINE 2% (20 MG/ML) 5 ML SYRINGE
INTRAMUSCULAR | Status: AC
  Filled 2020-01-27: qty 10

## 2020-01-27 MED ORDER — LACTATED RINGERS IV SOLN: INTRAVENOUS | Status: DC | PRN

## 2020-01-27 MED ORDER — CEFAZOLIN SODIUM 1 G IJ SOLR
INTRAMUSCULAR | Status: AC
  Filled 2020-01-27: qty 20

## 2020-01-27 MED ORDER — PHENYLEPHRINE 40 MCG/ML (10ML) SYRINGE FOR IV PUSH (FOR BLOOD PRESSURE SUPPORT)
PREFILLED_SYRINGE | INTRAVENOUS | Status: AC
  Filled 2020-01-27: qty 10

## 2020-01-27 MED ORDER — ACETAMINOPHEN 650 MG RE SUPP: 650.0000 mg | RECTAL | Status: DC | PRN

## 2020-01-27 MED ORDER — THROMBIN 20000 UNITS EX SOLR
CUTANEOUS | Status: AC
  Filled 2020-01-27: qty 20000

## 2020-01-27 MED ORDER — ACETAMINOPHEN 325 MG PO TABS: 650.0000 mg | ORAL_TABLET | ORAL | Status: DC | PRN

## 2020-01-27 MED ORDER — LIDOCAINE 2% (20 MG/ML) 5 ML SYRINGE
INTRAMUSCULAR | Status: DC | PRN
  Administered 2020-01-27: 60 mg via INTRAVENOUS

## 2020-01-27 MED ORDER — LABETALOL HCL 5 MG/ML IV SOLN
INTRAVENOUS | Status: AC
  Filled 2020-01-27: qty 4

## 2020-01-27 MED ORDER — BUPIVACAINE-EPINEPHRINE 0.25% -1:200000 IJ SOLN
INTRAMUSCULAR | Status: DC | PRN
  Administered 2020-01-27: 8 mL

## 2020-01-27 MED ORDER — KETAMINE HCL 50 MG/5ML IJ SOSY
PREFILLED_SYRINGE | INTRAMUSCULAR | Status: AC
  Filled 2020-01-27: qty 5

## 2020-01-27 MED ORDER — METHYLENE BLUE 0.5 % INJ SOLN
INTRAVENOUS | Status: AC
  Filled 2020-01-27: qty 10

## 2020-01-27 MED ORDER — HYDROMORPHONE HCL 1 MG/ML IJ SOLN
INTRAMUSCULAR | Status: DC | PRN
  Administered 2020-01-27 (×2): .5 mg via INTRAVENOUS

## 2020-01-27 MED ORDER — THROMBIN 20000 UNITS EX SOLR: CUTANEOUS | Status: DC | PRN

## 2020-01-27 MED ORDER — PHENYLEPHRINE 40 MCG/ML (10ML) SYRINGE FOR IV PUSH (FOR BLOOD PRESSURE SUPPORT)
PREFILLED_SYRINGE | INTRAVENOUS | Status: DC | PRN
  Administered 2020-01-27 (×3): 80 ug via INTRAVENOUS
  Administered 2020-01-27: 40 ug via INTRAVENOUS

## 2020-01-27 MED ORDER — MIDAZOLAM HCL 2 MG/2ML IJ SOLN
INTRAMUSCULAR | Status: DC | PRN
  Administered 2020-01-27: 2 mg via INTRAVENOUS

## 2020-01-27 MED ORDER — FENTANYL CITRATE (PF) 250 MCG/5ML IJ SOLN
INTRAMUSCULAR | Status: AC
  Filled 2020-01-27: qty 5

## 2020-01-27 MED ORDER — ONDANSETRON HCL 4 MG/2ML IJ SOLN
INTRAMUSCULAR | Status: DC | PRN
  Administered 2020-01-27: 4 mg via INTRAVENOUS

## 2020-01-27 MED ORDER — SUGAMMADEX SODIUM 200 MG/2ML IV SOLN
INTRAVENOUS | Status: DC | PRN
  Administered 2020-01-27: 200 mg via INTRAVENOUS

## 2020-01-27 MED ORDER — FLEET ENEMA 7-19 GM/118ML RE ENEM: 1.0000 | ENEMA | Freq: Once | RECTAL | Status: DC | PRN

## 2020-01-27 MED ORDER — CHLORHEXIDINE GLUCONATE 0.12 % MT SOLN
15.0000 mL | Freq: Once | OROMUCOSAL | Status: AC
  Filled 2020-01-27: qty 15

## 2020-01-27 MED ORDER — KETAMINE HCL 10 MG/ML IJ SOLN
INTRAMUSCULAR | Status: DC | PRN
  Administered 2020-01-27: 40 mg via INTRAVENOUS
  Administered 2020-01-27: 10 mg via INTRAVENOUS

## 2020-01-27 MED ORDER — CYCLOBENZAPRINE HCL 10 MG PO TABS
10.0000 mg | ORAL_TABLET | Freq: Three times a day (TID) | ORAL | Status: DC | PRN
  Administered 2020-01-28: 04:00:00 10 mg via ORAL
  Filled 2020-01-27: qty 1

## 2020-01-27 MED ORDER — PROPOFOL 10 MG/ML IV BOLUS
INTRAVENOUS | Status: DC | PRN
  Administered 2020-01-27: 150 mg via INTRAVENOUS

## 2020-01-27 MED ORDER — DEXAMETHASONE SODIUM PHOSPHATE 10 MG/ML IJ SOLN
INTRAMUSCULAR | Status: AC
  Filled 2020-01-27: qty 2

## 2020-01-27 MED ORDER — ZOLPIDEM TARTRATE 5 MG PO TABS: 5.0000 mg | ORAL_TABLET | Freq: Every evening | ORAL | Status: DC | PRN

## 2020-01-27 MED ORDER — ONDANSETRON HCL 4 MG PO TABS: 4.0000 mg | ORAL_TABLET | Freq: Four times a day (QID) | ORAL | Status: DC | PRN

## 2020-01-27 MED ORDER — HYDROMORPHONE HCL 1 MG/ML IJ SOLN: 0.2500 mg | INTRAMUSCULAR | Status: DC | PRN

## 2020-01-27 MED ORDER — ACETAMINOPHEN 500 MG PO TABS
ORAL_TABLET | ORAL | Status: AC
  Administered 2020-01-27: 15:00:00 1000 mg via ORAL
  Filled 2020-01-27: qty 2

## 2020-01-27 MED ORDER — ALUM & MAG HYDROXIDE-SIMETH 200-200-20 MG/5ML PO SUSP: 30.0000 mL | Freq: Four times a day (QID) | ORAL | Status: DC | PRN

## 2020-01-27 MED ORDER — DOCUSATE SODIUM 100 MG PO CAPS
100.0000 mg | ORAL_CAPSULE | Freq: Two times a day (BID) | ORAL | Status: DC
  Administered 2020-01-28 – 2020-01-29 (×3): 100 mg via ORAL
  Filled 2020-01-27 (×3): qty 1

## 2020-01-27 MED ORDER — EPHEDRINE SULFATE 50 MG/ML IJ SOLN
INTRAMUSCULAR | Status: DC | PRN
  Administered 2020-01-27: 5 mg via INTRAVENOUS

## 2020-01-27 MED ORDER — BISACODYL 5 MG PO TBEC: 5.0000 mg | DELAYED_RELEASE_TABLET | Freq: Every day | ORAL | Status: DC | PRN

## 2020-01-27 MED ORDER — CEFAZOLIN SODIUM-DEXTROSE 2-4 GM/100ML-% IV SOLN
2.0000 g | Freq: Three times a day (TID) | INTRAVENOUS | Status: AC
  Administered 2020-01-28 (×2): 2 g via INTRAVENOUS
  Filled 2020-01-27 (×3): qty 100

## 2020-01-27 MED ORDER — ONDANSETRON HCL 4 MG/2ML IJ SOLN
4.0000 mg | Freq: Three times a day (TID) | INTRAMUSCULAR | Status: DC
  Administered 2020-01-27: 04:00:00 4 mg via INTRAVENOUS
  Filled 2020-01-27: qty 2

## 2020-01-27 MED ORDER — POTASSIUM CHLORIDE IN NACL 20-0.9 MEQ/L-% IV SOLN
INTRAVENOUS | Status: DC
  Filled 2020-01-27: qty 1000

## 2020-01-27 MED ORDER — ONDANSETRON HCL 4 MG/2ML IJ SOLN
4.0000 mg | Freq: Four times a day (QID) | INTRAMUSCULAR | Status: DC | PRN
  Administered 2020-01-27 – 2020-01-28 (×2): 4 mg via INTRAVENOUS
  Filled 2020-01-27 (×2): qty 2

## 2020-01-27 MED ORDER — MORPHINE SULFATE (PF) 2 MG/ML IV SOLN
1.0000 mg | INTRAVENOUS | Status: DC | PRN
  Administered 2020-01-28 (×2): 2 mg via INTRAVENOUS
  Filled 2020-01-27 (×2): qty 1

## 2020-01-27 SURGICAL SUPPLY — 100 items
AGENT HMST KT MTR STRL THRMB (HEMOSTASIS)
APL SKNCLS STERI-STRIP NONHPOA (GAUZE/BANDAGES/DRESSINGS) ×1
BENZOIN TINCTURE PRP APPL 2/3 (GAUZE/BANDAGES/DRESSINGS) ×3 IMPLANT
BLADE CLIPPER SURG (BLADE) ×2 IMPLANT
BONE VIVIGEN FORMABLE 10CC (Bone Implant) ×3 IMPLANT
BUR PRESCISION 1.7 ELITE (BURR) ×3 IMPLANT
BUR ROUND FLUTED 5 RND (BURR) ×2 IMPLANT
BUR ROUND FLUTED 5MM RND (BURR) ×1
BUR ROUND PRECISION 4.0 (BURR) IMPLANT
BUR ROUND PRECISION 4.0MM (BURR)
BUR SABER RD CUTTING 3.0 (BURR) IMPLANT
BUR SABER RD CUTTING 3.0MM (BURR)
CAGE CONCORDE BULLET 9X11X27 (Cage) ×2 IMPLANT
CAGE CONCORDE BULLET 9X11X27MM (Cage) ×1 IMPLANT
CAGE SPNL 5D BLT NOSE 27X9X11 (Cage) IMPLANT
CARTRIDGE OIL MAESTRO DRILL (MISCELLANEOUS) ×1 IMPLANT
CLOSURE WOUND 1/2 X4 (GAUZE/BANDAGES/DRESSINGS) ×2
CNTNR URN SCR LID CUP LEK RST (MISCELLANEOUS) ×1 IMPLANT
CONNECTOR EXPEDIUM TI 55MM (Connector) ×2 IMPLANT
CONT SPEC 4OZ STRL OR WHT (MISCELLANEOUS) ×3
COVER BACK TABLE 60X90IN (DRAPES) ×3 IMPLANT
COVER MAYO STAND STRL (DRAPES) ×2 IMPLANT
COVER SURGICAL LIGHT HANDLE (MISCELLANEOUS) ×1 IMPLANT
COVER WAND RF STERILE (DRAPES) ×1 IMPLANT
DIFFUSER DRILL AIR PNEUMATIC (MISCELLANEOUS) ×3 IMPLANT
DRAIN CHANNEL 15F RND FF W/TCR (WOUND CARE) IMPLANT
DRAPE C-ARM 42X72 X-RAY (DRAPES) ×3 IMPLANT
DRAPE C-ARMOR (DRAPES) ×2 IMPLANT
DRAPE POUCH INSTRU U-SHP 10X18 (DRAPES) ×3 IMPLANT
DRAPE SURG 17X23 STRL (DRAPES) ×12 IMPLANT
DURAPREP 26ML APPLICATOR (WOUND CARE) ×3 IMPLANT
ELECT BLADE 4.0 EZ CLEAN MEGAD (MISCELLANEOUS) ×3
ELECT CAUTERY BLADE 6.4 (BLADE) ×3 IMPLANT
ELECT REM PT RETURN 9FT ADLT (ELECTROSURGICAL) ×3
ELECTRODE BLDE 4.0 EZ CLN MEGD (MISCELLANEOUS) ×1 IMPLANT
ELECTRODE REM PT RTRN 9FT ADLT (ELECTROSURGICAL) ×1 IMPLANT
EVACUATOR SILICONE 100CC (DRAIN) IMPLANT
FILTER STRAW FLUID ASPIR (MISCELLANEOUS) ×3 IMPLANT
GAUZE 4X4 16PLY RFD (DISPOSABLE) ×3 IMPLANT
GAUZE SPONGE 4X4 12PLY STRL (GAUZE/BANDAGES/DRESSINGS) ×3 IMPLANT
GLOVE BIO SURGEON STRL SZ7 (GLOVE) ×5 IMPLANT
GLOVE BIO SURGEON STRL SZ8 (GLOVE) ×5 IMPLANT
GLOVE BIOGEL PI IND STRL 7.0 (GLOVE) ×1 IMPLANT
GLOVE BIOGEL PI IND STRL 7.5 (GLOVE) IMPLANT
GLOVE BIOGEL PI IND STRL 8 (GLOVE) ×1 IMPLANT
GLOVE BIOGEL PI INDICATOR 7.0 (GLOVE) ×2
GLOVE BIOGEL PI INDICATOR 7.5 (GLOVE) ×2
GLOVE BIOGEL PI INDICATOR 8 (GLOVE) ×12
GLOVE ECLIPSE 7.5 STRL STRAW (GLOVE) ×8 IMPLANT
GOWN STRL REUS W/ TWL LRG LVL3 (GOWN DISPOSABLE) ×2 IMPLANT
GOWN STRL REUS W/ TWL XL LVL3 (GOWN DISPOSABLE) ×1 IMPLANT
GOWN STRL REUS W/TWL 2XL LVL3 (GOWN DISPOSABLE) ×4 IMPLANT
GOWN STRL REUS W/TWL LRG LVL3 (GOWN DISPOSABLE) ×3
GOWN STRL REUS W/TWL XL LVL3 (GOWN DISPOSABLE) ×3
GRAFT BNE MATRIX VG FRMBL L 10 (Bone Implant) IMPLANT
IV CATH 14GX2 1/4 (CATHETERS) ×3 IMPLANT
KIT BASIN OR (CUSTOM PROCEDURE TRAY) ×3 IMPLANT
KIT POSITION SURG JACKSON T1 (MISCELLANEOUS) ×3 IMPLANT
KIT TURNOVER KIT B (KITS) ×3 IMPLANT
MARKER SKIN DUAL TIP RULER LAB (MISCELLANEOUS) ×6 IMPLANT
NDL 18GX1X1/2 (RX/OR ONLY) (NEEDLE) ×1 IMPLANT
NDL HYPO 25GX1X1/2 BEV (NEEDLE) ×1 IMPLANT
NDL SPNL 18GX3.5 QUINCKE PK (NEEDLE) ×2 IMPLANT
NEEDLE 18GX1X1/2 (RX/OR ONLY) (NEEDLE) ×3 IMPLANT
NEEDLE 22X1 1/2 (OR ONLY) (NEEDLE) ×6 IMPLANT
NEEDLE HYPO 25GX1X1/2 BEV (NEEDLE) ×3 IMPLANT
NEEDLE SPNL 18GX3.5 QUINCKE PK (NEEDLE) ×6 IMPLANT
NS IRRIG 1000ML POUR BTL (IV SOLUTION) ×5 IMPLANT
OIL CARTRIDGE MAESTRO DRILL (MISCELLANEOUS) ×3
PACK LAMINECTOMY ORTHO (CUSTOM PROCEDURE TRAY) ×3 IMPLANT
PACK UNIVERSAL I (CUSTOM PROCEDURE TRAY) ×3 IMPLANT
PAD ARMBOARD 7.5X6 YLW CONV (MISCELLANEOUS) ×6 IMPLANT
PATTIES SURGICAL .5 X1 (DISPOSABLE) ×3 IMPLANT
PATTIES SURGICAL .5X1.5 (GAUZE/BANDAGES/DRESSINGS) ×3 IMPLANT
ROD EXPEDIUM 480MM (Rod) ×2 IMPLANT
ROD EXPEDIUM PRE BENT 55MM (Rod) ×2 IMPLANT
SCREW SET SINGLE INNER (Screw) ×4 IMPLANT
SCREW VIPER CORT FIX 6X35 (Screw) ×4 IMPLANT
SLEEVE SURGEON STRL (DRAPES) ×2 IMPLANT
SPONGE INTESTINAL PEANUT (DISPOSABLE) ×3 IMPLANT
SPONGE SURGIFOAM ABS GEL 100 (HEMOSTASIS) ×3 IMPLANT
STRIP CLOSURE SKIN 1/2X4 (GAUZE/BANDAGES/DRESSINGS) ×4 IMPLANT
SURGIFLO W/THROMBIN 8M KIT (HEMOSTASIS) IMPLANT
SUT MNCRL AB 4-0 PS2 18 (SUTURE) ×3 IMPLANT
SUT VIC AB 0 CT1 18XCR BRD 8 (SUTURE) ×1 IMPLANT
SUT VIC AB 0 CT1 8-18 (SUTURE) ×3
SUT VIC AB 1 CT1 18XCR BRD 8 (SUTURE) ×1 IMPLANT
SUT VIC AB 1 CT1 8-18 (SUTURE) ×3
SUT VIC AB 2-0 CT2 18 VCP726D (SUTURE) ×5 IMPLANT
SYR 20ML LL LF (SYRINGE) ×6 IMPLANT
SYR BULB IRRIG 60ML STRL (SYRINGE) ×3 IMPLANT
SYR CONTROL 10ML LL (SYRINGE) ×8 IMPLANT
SYR TB 1ML LUER SLIP (SYRINGE) ×3 IMPLANT
TAP EXPEDIUM DL 4.35 (INSTRUMENTS) ×2 IMPLANT
TAP EXPEDIUM DL 5.0 (INSTRUMENTS) ×2 IMPLANT
TAP EXPEDIUM DL 6.0 (INSTRUMENTS) ×2 IMPLANT
TAPE CLOTH SURG 4X10 WHT LF (GAUZE/BANDAGES/DRESSINGS) ×2 IMPLANT
TRAY FOLEY MTR SLVR 16FR STAT (SET/KITS/TRAYS/PACK) ×3 IMPLANT
WATER STERILE IRR 1000ML POUR (IV SOLUTION) ×3 IMPLANT
YANKAUER SUCT BULB TIP NO VENT (SUCTIONS) ×3 IMPLANT

## 2020-01-27 NOTE — Progress Notes (Signed)
I did review the patient's MRI with him yesterday evening.  He does have an increase in size of his previously noted left-sided L4-L5 disc herniation.  He continues to have excruciating pain in his left leg and low back.  We did again discuss the options available to him.  He continues to have substantial difficulty simply ambulating to the bathroom.  His pain continues to be constant and severe.  Given this, we did discuss the details of surgical intervention, specifically, a decompression at L4-L5, in combination with a left-sided transforaminal lumbar interbody fusion at L4-L5, with instrumentation and allograft. We extensively discussed the risks and recovery period associated with surgery.  The patient understands that the risks include infection, nerve injury (which may result in permanent numbness, weakness or pain), recurrent stenosis, lack of resolution of back pain, postoperative instability, bleeding, cerebral spinal fluid leak, possible lack of resolution of leg pain, nonunion, failure of instrumentation, DVT.  The patient understands that he would likely remain in the hospital for 1-2 days following surgery, and that full recovery would likely take 5-6 months. The patient understands to avoid any bending lifting or twisting for 3 months.  After a full understanding of the risks, benefits, and recovery associated with surgery, the patient does wish to proceed.  We will proceed with getting surgery set up at the patient's earliest convenience.  The patient understands to contact me with any questions prior to surgery.  Patient will be set up with a TLSO brace to be worn for 6 weeks following surgery.   Patient does wish to proceed.  At the time of this dictation, we do have a tentative plan to proceed with surgical intervention at 3:30 this afternoon.  He is currently NPO.

## 2020-01-27 NOTE — Anesthesia Procedure Notes (Signed)
Procedure Name: Intubation Performed by: Teressa Lower., CRNA Pre-anesthesia Checklist: Patient identified, Emergency Drugs available, Suction available and Patient being monitored Patient Re-evaluated:Patient Re-evaluated prior to induction Oxygen Delivery Method: Circle system utilized Preoxygenation: Pre-oxygenation with 100% oxygen Induction Type: IV induction Ventilation: Mask ventilation without difficulty Laryngoscope Size: Mac and 4 Grade View: Grade I Tube type: Oral Tube size: 7.5 mm Number of attempts: 1 Airway Equipment and Method: Stylet Placement Confirmation: ETT inserted through vocal cords under direct vision,  positive ETCO2 and breath sounds checked- equal and bilateral Secured at: 24 cm Tube secured with: Tape Dental Injury: Teeth and Oropharynx as per pre-operative assessment

## 2020-01-27 NOTE — Anesthesia Preprocedure Evaluation (Addendum)
Anesthesia Evaluation  Patient identified by MRN, date of birth, ID band Patient awake    Reviewed: Allergy & Precautions, H&P , NPO status , Patient's Chart, lab work & pertinent test results  Airway Mallampati: II  TM Distance: >3 FB Neck ROM: Full    Dental no notable dental hx. (+) Teeth Intact, Dental Advisory Given   Pulmonary neg pulmonary ROS, former smoker,    Pulmonary exam normal breath sounds clear to auscultation       Cardiovascular negative cardio ROS   Rhythm:Regular Rate:Normal     Neuro/Psych negative neurological ROS  negative psych ROS   GI/Hepatic negative GI ROS, Neg liver ROS,   Endo/Other  negative endocrine ROS  Renal/GU negative Renal ROS  negative genitourinary   Musculoskeletal   Abdominal   Peds  Hematology negative hematology ROS (+)   Anesthesia Other Findings   Reproductive/Obstetrics negative OB ROS                            Anesthesia Physical Anesthesia Plan  ASA: I  Anesthesia Plan: General   Post-op Pain Management:    Induction: Intravenous  PONV Risk Score and Plan: 3 and Ondansetron, Dexamethasone and Midazolam  Airway Management Planned: Oral ETT  Additional Equipment:   Intra-op Plan:   Post-operative Plan: Extubation in OR  Informed Consent: I have reviewed the patients History and Physical, chart, labs and discussed the procedure including the risks, benefits and alternatives for the proposed anesthesia with the patient or authorized representative who has indicated his/her understanding and acceptance.     Dental advisory given  Plan Discussed with: CRNA  Anesthesia Plan Comments:         Anesthesia Quick Evaluation  

## 2020-01-27 NOTE — Progress Notes (Signed)
Patient off the unit for surgery.

## 2020-01-27 NOTE — Transfer of Care (Signed)
Immediate Anesthesia Transfer of Care Note  Patient: Todd Santana  Procedure(s) Performed: Left Lumbar four-five Transforaminal Lumbar Interbody Fusion with Instrumentation and Allograft (Left Back)  Patient Location: PACU  Anesthesia Type:General  Level of Consciousness: drowsy  Airway & Oxygen Therapy: Patient Spontanous Breathing and Patient connected to face mask oxygen  Post-op Assessment: Report given to RN and Post -op Vital signs reviewed and stable  Post vital signs: Reviewed and stable  Last Vitals:  Vitals Value Taken Time  BP 104/59 01/27/20 2039  Temp    Pulse 95 01/27/20 2042  Resp 15 01/27/20 2042  SpO2 100 % 01/27/20 2042  Vitals shown include unvalidated device data.  Last Pain:  Vitals:   01/27/20 1303  TempSrc:   PainSc: 2       Patients Stated Pain Goal: 2 (01/27/20 0428)  Complications: No complications documented.

## 2020-01-27 NOTE — Progress Notes (Signed)
Patient received from ED, complaining of excruciating pain on his lower back. Patient has NPO orders without specification. Call placed with Hampton Abbot, secretary notified about the situation, .Awaiting response from MD

## 2020-01-27 NOTE — H&P (Signed)
PREOPERATIVE H&P  Chief Complaint: Severe left leg pain  HPI: Todd Santana is a 49 y.o. 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 Allergy   . Lumbago 04/01/2009   Past Surgical History:  Procedure Laterality Date  . BACK SURGERY     x2 total    . broken leg  2000  . CRANIOTOMY N/A 03/05/2015   Procedure: Transsphenoidal resection of pituitary tumor with Dr. Emeline Darling for approach;  Surgeon: Coletta Memos, MD;  Location: [facility];  Service: Neurosurgery;  Laterality: N/A;  . SINUS ENDO W/FUSION N/A 03/05/2015   Procedure: ENDOSCOPIC SINUS SURGERY WITH NAVIGATION;  Surgeon: Melvenia Beam, MD;  Location: [facility];  Service: ENT;  Laterality: N/A;  . SPINE SURGERY     ruptured disk 2002  . VASECTOMY     Social History   Socioeconomic History  . Marital status: Married    Spouse name: Not on file  . Number of children: Not on file  . Years of education: Not on file  . Highest education level: Not on file  Occupational History  . Not on file  Tobacco Use  . Smoking status: Former Games developer  . Smokeless tobacco: Never Used  Substance and Sexual Activity  . Alcohol use: Yes    Comment: occ  . Drug use: No  . Sexual activity: Not on file  Other Topics Concern  . Not on file  Social History Narrative  . Not on file   Social Determinants of Health   Financial Resource Strain: Not on file  Food Insecurity: Not on file  Transportation Needs: Not on file  Physical Activity: Not on file  Stress: Not on file  Social Connections: Not on file   Family History  Problem Relation Age  of Onset  . Healthy Mother   . Cancer Other        grandmother, breast CA   No Known Allergies Prior to Admission medications   Medication Sig Start Date End Date Taking? Authorizing Provider  baclofen (LIORESAL) 10 MG tablet Take 10 mg by mouth at bedtime. 01/14/20  Yes [provider]  ibuprofen (ADVIL) 200 MG tablet Take 600 mg by mouth every 6 (six) hours as needed (pain).   Yes [provider]  loratadine (CLARITIN) 10 MG tablet Take 10 mg by mouth daily as needed (seasonal allergies).   Yes [provider]  meloxicam (MOBIC) 15 MG tablet Take 15 mg by mouth daily. 01/14/20  Yes [provider]  oxyCODONE-acetaminophen (PERCOCET) 10-325 MG tablet Take 2 tablets by mouth every 4 (four) hours as needed for pain.   Yes [provider]     All other systems have been reviewed and were otherwise negative with the exception of those mentioned in the HPI and as above.  Physical Exam: Vitals:   01/27/20 0556 01/27/20 0804  BP: 136/88 (!) 134/91  Pulse: 88 77  Resp: 18 20  Temp: 98.4 F (36.9 C) 98 F (36.7 C)  SpO2:  100%    Body mass index is 22.39 kg/m.  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

## 2020-01-27 NOTE — Op Note (Signed)
PATIENT NAME: Todd Santana   MEDICAL RECORD NO.:   [id number]   DATE OF BIRTH: [birth year]   DATE OF PROCEDURE: 01/27/2020                               OPERATIVE REPORT     PREOPERATIVE DIAGNOSES: 1. Severe left L5 radiculopathy. 2. 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 Status post previous L5-S1 fusion   POSTOPERATIVE DIAGNOSES: 1. Severe left L5 radiculopathy. 2. 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 Status post previous L5-S1 fusion   PROCEDURES: 1. L4/5 decompression, including removal of left L4-5 disc herniation 2. Left-sided L4-5 transforaminal lumbar interbody fusion. 3. Right-sided L4-5 posterolateral fusion. 4. Insertion of interbody device x1 (87mm Concorde intervertebral spacer). 5. Placement of segmental  bilateral posterior instrumentation L4 (connected to the previously placed L5 and S1 instrumentation construct) 6. Use of local autograft. 7. Use of morselized allograft - Vivigen 8. Intraoperative use of fluoroscopy.   SURGEON:  Estill Bamberg, MD.   ASSISTANTJason Coop, PA-C.   ANESTHESIA:  General endotracheal anesthesia.   COMPLICATIONS:  None.   DISPOSITION:  Stable.   ESTIMATED BLOOD LOSS:  100cc   INDICATIONS FOR SURGERY:  Briefly, Hollis is a pleasant 49 year old male who is status post a previous L5-S1 fusion, in 2016.  He did very well from that surgery, but more recently, has been having pain in his left leg. 2 days ago, his pain had become very severe, where he was unable to safely get around the house and care for himself at home. Given his inability to perform routine activities of daily living, he did present to the emergency department, at which time I did evaluate him, where it was clearly notable that he was in severe pain and dysfunction.  An updated MRI did reveal an increase in the size of his previously noted left-sided L4-5 disc herniation. A CAT scan was notable for successful fusion across L5-S1.  Given the patient's severe and debilitating  left leg pain, we did discuss proceeding with the surgery noted above.  The patient was informed of the risks and limitations associated with surgery, and did elect to proceed.  Of note, we did discuss the possibility of a decompressive procedure, however, given his solid fusion at L5-S1, I did feel that a decompression alone would substantially increase the risk of subsequent pathology at the L4-5 level, and the need for additional surgery at L4-5, and therefore, we did elect to proceed with the surgery noted above.    OPERATIVE DETAILS:  On 01/27/2020, the patient was brought to surgery and general endotracheal anesthesia was administered.  The patient was placed prone on a well-padded flat Jackson bed with a spinal frame.  Antibiotics were given and a time-out procedure was performed. The back was prepped and draped in the usual fashion.  A midline incision was made overlying the L4-5 and L5-S1 intervertebral spaces.  The fascia was incised at the midline.  The paraspinal musculature was bluntly swept laterally.  Anatomic landmarks for the pedicles were exposed. Using fluoroscopy, I did cannulate the L4 pedicles bilaterally, using a medial to lateral cortical trajectory technique.  At this point, a 6 x 30 mm screw was placed into the right pedicle.  A 50 mm rod was then secured into the tulip head of the right L4 pedicle, and at the inferior aspect of the rod, a cross connector was placed, which was connected to the L5-S1 rod.  Distraction was applied across the L4-5 intervertebral space, and caps were then placed and provisionally tightened. On the left side, bone wax was placed into the cannulated pedicle hole.  I then proceeded with the decompressive aspect of the procedure at the L4-5 level.  A partial facetectomy was performed on the left at L4-5.  At this point, the traversing left L5 nerve was noted, and was gently mobilized medially.  Immediately ventral to the nerve was noted to be a  moderate sized herniated disc fragment, clearly causing severe compression to the left L5 nerve.  The fragment was removed in its entirety, entirely decompressing the traversing left L5 nerve.  With an assistant continuing to hold medial retraction of the traversing left L5 nerve, I did perform an annulotomy at the posterolateral aspect of the L4-5 intervertebral space.  I then used a series of curettes and pituitary rongeurs to perform a thorough and complete intervertebral diskectomy.  The intervertebral space was then liberally packed with autograft as well as allograft in the form of Vivigen, as was the appropriate-sized intervertebral spacer (11 mm, lordotic).  The spacer was then tamped into position in the usual fashion.  I was very pleased with the press-fit of the spacer.  I then placed a 6 mm screw on the left at L4. A 50-mm rod was then placed, and a cap was placed over the L4 pedicle screw.  As was done on the right, a cross connector was secured to the inferior aspect of the rod, and connected to the L5-S1 construct. Distraction was then released on the contralateral right side.  The cross connectors were tightened, and the bilateral L4 caps were final tightened as well. I was very pleased with the final fusion construct. The wound was copiously irrigated with a total of approximately 3 L prior to placing the bone graft.  Additional autograft and allograft was then packed into the posterolateral gutter on the right side to help aid in the success of the fusion.  The wound was  explored for any undue bleeding and there was no substantial bleeding encountered.  Gel-Foam was placed over the laminectomy site.  The wound was then closed in layers using #1 Vicryl followed by 2-0 Vicryl, followed by 4-0 Monocryl.  Benzoin and Steri-Strips were applied followed by sterile dressing.     Of note, Pricilla Holm was my assistant throughout surgery, and did aid in retraction, suctioning,  placement of the hardware, and closure.       Phylliss Bob, MD

## 2020-01-28 ENCOUNTER — Encounter (HOSPITAL_COMMUNITY): Payer: Self-pay | Admitting: Orthopedic Surgery

## 2020-01-28 MED ORDER — OXYCODONE-ACETAMINOPHEN 5-325 MG PO TABS
1.0000 | ORAL_TABLET | ORAL | Status: DC | PRN
  Administered 2020-01-28 – 2020-01-29 (×7): 2 via ORAL
  Filled 2020-01-28 (×7): qty 2

## 2020-01-28 MED ORDER — METHOCARBAMOL 500 MG PO TABS
500.0000 mg | ORAL_TABLET | Freq: Four times a day (QID) | ORAL | Status: DC | PRN
  Administered 2020-01-28 – 2020-01-29 (×4): 500 mg via ORAL
  Filled 2020-01-28 (×4): qty 1

## 2020-01-28 NOTE — Progress Notes (Signed)
Dr Ronne Binning, PA  Todd Santana, returned the page. MD notified of patient's concerns about the numbness and tingling on his left lower extremity, and right thigh.Per MD, She "okay with the tingling and numbness in as much it is not at the groin areas,and not preventing patient from urinating".However, if the condition progressively radiates to the groin area,must be notified immediately. Will continue monitoring.

## 2020-01-28 NOTE — Progress Notes (Signed)
Report given to Liam Graham- Laural Benes RN. Patient is stable will transfer to [facility] by wheelchair.

## 2020-01-28 NOTE — Progress Notes (Signed)
Patient work up with complaints of numbness and tingling on both lower extremities. Call placed to [facility], and Cath, stated that she was going to page Dr. Lillette Boxer in turn will give me a call. Will continue monitoring

## 2020-01-28 NOTE — Anesthesia Postprocedure Evaluation (Signed)
Anesthesia Post Note  Patient: Todd Santana  Procedure(s) Performed: Left Lumbar four-five Transforaminal Lumbar Interbody Fusion with Instrumentation and Allograft (Left Back)     Patient location during evaluation: PACU Anesthesia Type: General Level of consciousness: awake and alert Pain management: pain level controlled Vital Signs Assessment: post-procedure vital signs reviewed and stable Respiratory status: spontaneous breathing, nonlabored ventilation, respiratory function stable and patient connected to nasal cannula oxygen Cardiovascular status: blood pressure returned to baseline and stable Postop Assessment: no apparent nausea or vomiting Anesthetic complications: no   No complications documented.  Last Vitals:  Vitals:   01/27/20 2359 01/28/20 0402  BP: 119/76   Pulse: (!) 101   Resp:    Temp: 36.9 C (!) 36.4 C  SpO2: 98%     Last Pain:  Vitals:   01/28/20 0610  TempSrc:   PainSc: 8                  Ivonna Kinnick S

## 2020-01-28 NOTE — Evaluation (Addendum)
Occupational Therapy Evaluation Patient Details Name: Todd Santana MRN: [id number] DOB: 19-Feb-1970 Today's Date: 01/28/2020    History of Present Illness Pt is a 49 y.o. male s/p decompression and fusion at L4/5. PMH consists of previou S1-L5 fusion in 2016.   Clinical Impression   This 49 y/o male presents with the above. PTA pt typically independent with ADL and functional mobility, though reports significant difficulty with mobility due to pain during week leading up to his sx and using RW. Today pt guarded due to pain, but overall tolerating session well. Pt demonstrating mobility tasks with up to minA using RW, requiring maxA for LB ADL and modA for UB ADL including brace management. Reviewed and educated both pt/pt's spouse re: back precautions, brace management, safety and compensatory strategies for performing ADL and mobility tasks with pt/pt's spouse verbalizing and/or return demonstrating understanding. Pt will have support/assist from spouse and additional family members at time of discharge for ADL/iADL PRN. He will benefit from continued acute OT services to maximize his overall safety and independence with ADL and mobility prior to return home, do not anticipate he will require follow up OT services after discharge.     Follow Up Recommendations  No OT follow up;Supervision/Assistance - 24 hour    Equipment Recommendations  3 in 1 bedside commode           Precautions / Restrictions Precautions Precautions: Back Precaution Booklet Issued: Yes (comment) Precaution Comments: Reviewed 3/3 back precautions. Pt has handout in room. Required Braces or Orthoses: Spinal Brace Spinal Brace: Thoracolumbosacral orthotic;Applied in sitting position (per order set, can apply in sitting or standing) Restrictions Weight Bearing Restrictions: No      Mobility Bed Mobility               General bed mobility comments: sitting up in recliner upon arrival     Transfers Overall transfer level: Needs assistance Equipment used: Rolling walker (2 wheeled) Transfers: Sit to/from Stand Sit to Stand: Min assist         General transfer comment: assist to power up to full stand    Balance Overall balance assessment: Needs assistance Sitting-balance support: No upper extremity supported;Feet supported Sitting balance-Leahy Scale: Good     Standing balance support: Bilateral upper extremity supported;No upper extremity supported;During functional activity Standing balance-Leahy Scale: Fair Standing balance comment: static stand without UE support; with prefence for at least single UE support                           ADL either performed or assessed with clinical judgement   ADL Overall ADL's : Needs assistance/impaired Eating/Feeding: Modified independent;Sitting   Grooming: Min guard;Standing   Upper Body Bathing: Min guard;Sitting   Lower Body Bathing: Moderate assistance;Sit to/from stand   Upper Body Dressing : Moderate assistance;Standing;Sitting Upper Body Dressing Details (indicate cue type and reason): assist for overhead shirt and TLSO brace Lower Body Dressing: Maximal assistance;Sit to/from stand Lower Body Dressing Details (indicate cue type and reason): assist to thread LEs into shorts/underwear and to advance over hips; minguard for standing balance. spouse reports able to assist PRN after discharge Toilet Transfer: Min guard;Ambulation;RW Toilet Transfer Details (indicate cue type and reason): simulated via transfer to/from recliner Toileting- Clothing Manipulation and Hygiene: Moderate assistance;Sit to/from stand       Functional mobility during ADLs: Min guard;Rolling walker  Pertinent Vitals/Pain Pain Assessment: 0-10 Pain Score: 4  Pain Location: back/sx site Pain Descriptors / Indicators: Pressure;Discomfort;Grimacing;Guarding Pain Intervention(s): Limited activity  within patient's tolerance;Monitored during session;Premedicated before session;Repositioned     Hand Dominance     Extremity/Trunk Assessment Upper Extremity Assessment Upper Extremity Assessment: Overall WFL for tasks assessed   Lower Extremity Assessment Lower Extremity Assessment: Defer to PT evaluation   Cervical / Trunk Assessment Cervical / Trunk Assessment: Other exceptions Cervical / Trunk Exceptions: s/p lumbar fusion   Communication Communication Communication: No difficulties   Cognition Arousal/Alertness: Awake/alert Behavior During Therapy: WFL for tasks assessed/performed Overall Cognitive Status: Within Functional Limits for tasks assessed                                     General Comments       Exercises     Shoulder Instructions      Home Living Family/patient expects to be discharged to:: Private residence Living Arrangements: Spouse/significant other Available Help at Discharge: Family;Available 24 hours/day Type of Home: House Home Access: Stairs to enter [employer] of Steps: 1 (threshold)   Home Layout: One level     Bathroom Shower/Tub: Producer, television/film/video: Standard     Home Equipment: Environmental consultant - 2 wheels          Prior Functioning/Environment Level of Independence: Independent        Comments: Pt has needed RW to ambulation for past week due to LLE pain radiating from back.        OT Problem List: Decreased strength;Decreased range of motion;Decreased activity tolerance;Impaired balance (sitting and/or standing);Decreased knowledge of use of DME or AE;Decreased knowledge of precautions;Pain      OT Treatment/Interventions: Self-care/ADL training;Therapeutic exercise;Energy conservation;DME and/or AE instruction;Therapeutic activities;Balance training;Patient/family education    OT Goals(Current goals can be found in the care plan section) Acute Rehab OT Goals Patient Stated Goal:  home OT Goal Formulation: With patient Time For Goal Achievement: 02/11/20 Potential to Achieve Goals: Good  OT Frequency: Min 2X/week   Barriers to D/C:            Co-evaluation              AM-PAC OT "6 Clicks" Daily Activity     Outcome Measure Help from another person eating meals?: None Help from another person taking care of personal grooming?: A Little Help from another person toileting, which includes using toliet, bedpan, or urinal?: A Lot Help from another person bathing (including washing, rinsing, drying)?: A Lot Help from another person to put on and taking off regular upper body clothing?: A Little Help from another person to put on and taking off regular lower body clothing?: A Lot 6 Click Score: 16   End of Session Equipment Utilized During Treatment: Rolling walker;Back brace Nurse Communication: Mobility status  Activity Tolerance: Patient tolerated treatment well Patient left: with family/visitor present;Other (comment) (handoff to PT for session)  OT Visit Diagnosis: Other abnormalities of gait and mobility (R26.89);Pain Pain - part of body:  (back)                Time: 3500-9381 OT Time Calculation (min): 23 min Charges:  OT General Charges $OT Visit: 1 Visit OT Evaluation $OT Eval Low Complexity: 1 Low  Marcy Siren, OT Acute Rehabilitation Services Pager [phone] Office [phone]   Todd Santana 01/28/2020, 2:00 PM

## 2020-01-28 NOTE — Progress Notes (Signed)
Condom catheter was removed at 0829. Patient transfered to [facility] 3C room 11 with backpack, phone, walker and all other belongings with him. Patient tolerated transfer well.

## 2020-01-28 NOTE — Evaluation (Signed)
Physical Therapy Evaluation Patient Details Name: Todd Santana MRN: [id number] DOB: [birth year] Today's Date: 01/28/2020   History of Present Illness  Pt is a 49 y.o. male s/p decompression and fusion at L4/5. PMH consists of previou S1-L5 fusion in 2016.  Clinical Impression  Patient is s/p above surgery resulting in the deficits listed below (see PT Problem List). On eval, pt required min assist transfers, min guard assist ambulation 175' with RW, and min assist ascend/descend 2 steps with L rail. Patient will benefit from skilled PT to increase their independence and safety with mobility (while adhering to their precautions) to allow discharge to the venue listed below.     Follow Up Recommendations No PT follow up;Supervision for mobility/OOB    Equipment Recommendations  None recommended by PT    Recommendations for Other Services       Precautions / Restrictions Precautions Precautions: Back Precaution Comments: Reviewed 3/3 back precautions. Pt has handout in room. Required Braces or Orthoses: Spinal Brace Spinal Brace: Thoracolumbosacral orthotic;Applied in sitting position Restrictions Weight Bearing Restrictions: No      Mobility  Bed Mobility               General bed mobility comments: Pt received in recliner and returned to recliner.    Transfers Overall transfer level: Needs assistance Equipment used: Rolling walker (2 wheeled) Transfers: Sit to/from Stand Sit to Stand: Min assist         General transfer comment: cues for sequencing, assist to power up  Ambulation/Gait Ambulation/Gait assistance: Min guard Gait Distance (Feet): 175 Feet Assistive device: Rolling walker (2 wheeled) Gait Pattern/deviations: Step-through pattern;Decreased stride length Gait velocity: decreased Gait velocity interpretation: <1.31 ft/sec, indicative of household ambulator General Gait Details: steady gait with RW  Stairs Stairs: Yes Stairs assistance: Min  assist Stair Management: One rail Left;Forwards Number of Stairs: 2    Wheelchair Mobility    Modified Rankin (Stroke Patients Only)       Balance Overall balance assessment: Needs assistance Sitting-balance support: No upper extremity supported;Feet supported Sitting balance-Leahy Scale: Good     Standing balance support: Bilateral upper extremity supported;No upper extremity supported;During functional activity Standing balance-Leahy Scale: Fair Standing balance comment: static stand without UE support                             Pertinent Vitals/Pain Pain Assessment: 0-10 Pain Score: 5  Pain Location: back/sx site Pain Descriptors / Indicators: Pressure;Discomfort;Grimacing;Guarding Pain Intervention(s): Monitored during session;Repositioned    Home Living Family/patient expects to be discharged to:: Private residence Living Arrangements: Spouse/significant other Available Help at Discharge: Family;Available 24 hours/day Type of Home: House Home Access: Stairs to enter   [employer] of Steps: 1 (threshold) Home Layout: One level Home Equipment: Walker - 2 wheels      Prior Function Level of Independence: Independent         Comments: Pt has needed RW to ambulation for past week due to LLE pain radiating from back.     Hand Dominance        Extremity/Trunk Assessment   Upper Extremity Assessment Upper Extremity Assessment: Defer to OT evaluation    Lower Extremity Assessment Lower Extremity Assessment: RLE deficits/detail;LLE deficits/detail RLE Deficits / Details: numbness R lateral thigh LLE Deficits / Details: numbness/tingling L forefoot    Cervical / Trunk Assessment Cervical / Trunk Assessment: Other exceptions Cervical / Trunk Exceptions: s/p lumbar fusion  Communication  Communication: No difficulties  Cognition Arousal/Alertness: Awake/alert Behavior During Therapy: WFL for tasks assessed/performed Overall  Cognitive Status: Within Functional Limits for tasks assessed                                        General Comments      Exercises Other Exercises Other Exercises: Pt instructed in trunk stretches per his request. Pt advised/educated on importance of not engaging in stretches/exercises until cleared by surgeon. Pt verbalizes understanding.   Assessment/Plan    PT Assessment Patient needs continued PT services  PT Problem List Decreased mobility;Pain;Decreased balance;Decreased activity tolerance;Impaired sensation       PT Treatment Interventions Therapeutic activities;Gait training;Therapeutic exercise;Patient/family education;Stair training;Balance training;Functional mobility training    PT Goals (Current goals can be found in the Care Plan section)  Acute Rehab PT Goals Patient Stated Goal: home PT Goal Formulation: With patient Time For Goal Achievement: 02/04/20 Potential to Achieve Goals: Good    Frequency Min 5X/week   Barriers to discharge        Co-evaluation               AM-PAC PT "6 Clicks" Mobility  Outcome Measure Help needed turning from your back to your side while in a flat bed without using bedrails?: None Help needed moving from lying on your back to sitting on the side of a flat bed without using bedrails?: A Little Help needed moving to and from a bed to a chair (including a wheelchair)?: A Little Help needed standing up from a chair using your arms (e.g., wheelchair or bedside chair)?: A Little Help needed to walk in hospital room?: A Little Help needed climbing 3-5 steps with a railing? : A Little 6 Click Score: 19    End of Session Equipment Utilized During Treatment: Back brace Activity Tolerance: Patient tolerated treatment well Patient left: in chair;with call bell/phone within reach;with family/visitor present Nurse Communication: Mobility status PT Visit Diagnosis: Difficulty in walking, not elsewhere classified  (R26.2);Pain    Time: [id number] PT Time Calculation (min) (ACUTE ONLY): 22 min   Charges:   PT Evaluation $PT Eval Moderate Complexity: 1 Mod          Lorrin Goodell, PT  Office # [phone] Pager [phone]   Lorriane Shire 01/28/2020, 10:07 AM

## 2020-01-28 NOTE — Progress Notes (Signed)
    Patient doing well  Reports moderate LBP Reports numbness and tingling in left thigh and resolution of severe preop left leg pain Also reports numbness at right anterior/lateral thigh   Physical Exam: Vitals:   01/27/20 2359 01/28/20 0402  BP: 119/76   Pulse: (!) 101   Resp:    Temp: 98.4 F (36.9 C) (!) 97.5 F (36.4 C)  SpO2: 98%     Dressing in place NVI  POD #1 s/p decompression and fusion at L4/5 with resolved preop pain, with left leg numbness and tingling  - up with PT/OT, encourage ambulation - Percocet for pain, Robaxin for muscle spasms - Will follow symptoms of numbness and tingling as this is likely residual radiculopathy, with no signs of CES. This is very likely to improve, as is right thigh numbness, which is very likely positional.

## 2020-01-28 NOTE — Progress Notes (Signed)
Patient stated that the numbness and tingling is on his  Left lower leg ,and right thigh.

## 2020-01-29 MED ORDER — METHOCARBAMOL 500 MG PO TABS: 500.0000 mg | ORAL_TABLET | Freq: Four times a day (QID) | ORAL | 1 refills | Status: AC | PRN

## 2020-01-29 MED ORDER — OXYCODONE-ACETAMINOPHEN 5-325 MG PO TABS: 1.0000 | ORAL_TABLET | ORAL | 0 refills | Status: AC | PRN

## 2020-01-29 NOTE — Progress Notes (Signed)
Patient is discharged from room 3C11 at this time. Alert and in stable condition. IV site d/c'd and instructions read to patient and spouse with understanding verbalized and all questions answered. Left unit via wheelchair with all belongings at side.

## 2020-01-29 NOTE — Progress Notes (Signed)
Physical Therapy Treatment and Discharge Patient Details Name: Todd Santana MRN: [id number] DOB: July 13, 1970 Today's Date: 01/29/2020    History of Present Illness Pt is a 49 y.o. male s/p decompression and fusion at L4/5. PMH consists of previou S1-L5 fusion in 2016.    PT Comments    Pt progressing well with post-op mobility. He was able to demonstrate transfers and ambulation with gross modified independence. Pt was educated on precautions, brace application/wearing schedule, appropriate activity progression, and car transfer. Brace adjusted for optimal fit. Will continue to follow.      Follow Up Recommendations  No PT follow up;Supervision - Intermittent     Equipment Recommendations  None recommended by PT    Recommendations for Other Services       Precautions / Restrictions Precautions Precautions: Back Precaution Booklet Issued: Yes (comment) Precaution Comments: Reviewed 3/3 back precautions. Pt has handout in room. Required Braces or Orthoses: Spinal Brace Spinal Brace: Thoracolumbosacral orthotic;Applied in sitting position Restrictions Weight Bearing Restrictions: No    Mobility  Bed Mobility Overal bed mobility: Modified Independent Bed Mobility: Rolling;Sidelying to Sit           General bed mobility comments: HOB flat and rails lowered to simulate home environment.  Transfers Overall transfer level: Modified independent Equipment used: Rolling walker (2 wheeled) Transfers: Sit to/from Stand Sit to Stand: Supervision         General transfer comment: No assist. Min cues for hand placement on seated surface for safety.  Ambulation/Gait Ambulation/Gait assistance: Modified independent (Device/Increase time) Gait Distance (Feet): 275 Feet Assistive device: Rolling walker (2 wheeled) Gait Pattern/deviations: Step-through pattern;Decreased stride length Gait velocity: decreased Gait velocity interpretation: 1.31 - 2.62 ft/sec, indicative of  limited community ambulator General Gait Details: steady gait with RW. Min cues for improved posture with good corrective change.   Stairs   Stairs assistance: Min assist Stair Management: One rail Left;Forwards   General stair comments: Pt declined further stair training.   Wheelchair Mobility    Modified Rankin (Stroke Patients Only)       Balance Overall balance assessment: Needs assistance Sitting-balance support: No upper extremity supported;Feet supported Sitting balance-Leahy Scale: Good     Standing balance support: Bilateral upper extremity supported;No upper extremity supported;During functional activity Standing balance-Leahy Scale: Fair Standing balance comment: static stand without UE support; with prefence for at least single UE support                            Cognition Arousal/Alertness: Awake/alert Behavior During Therapy: WFL for tasks assessed/performed Overall Cognitive Status: Within Functional Limits for tasks assessed                                        Exercises Other Exercises Other Exercises: Pt instructed in trunk stretches per his request. Pt advised/educated on importance of not engaging in stretches/exercises until cleared by surgeon. Pt verbalizes understanding.    General Comments        Pertinent Vitals/Pain Pain Assessment: Faces Faces Pain Scale: Hurts little more Pain Location: back/sx site Pain Descriptors / Indicators: Grimacing;Operative site guarding Pain Intervention(s): Limited activity within patient's tolerance;Monitored during session;Repositioned    Home Living                      Prior Function  PT Goals (current goals can now be found in the care plan section) Acute Rehab PT Goals Patient Stated Goal: home today PT Goal Formulation: With patient Time For Goal Achievement: 02/04/20 Potential to Achieve Goals: Good Progress towards PT goals: Goals  met/education completed, patient discharged from PT    Frequency    Min 5X/week      PT Plan Current plan remains appropriate    Co-evaluation              AM-PAC PT "6 Clicks" Mobility   Outcome Measure  Help needed turning from your back to your side while in a flat bed without using bedrails?: None Help needed moving from lying on your back to sitting on the side of a flat bed without using bedrails?: None Help needed moving to and from a bed to a chair (including a wheelchair)?: None Help needed standing up from a chair using your arms (e.g., wheelchair or bedside chair)?: None Help needed to walk in hospital room?: None Help needed climbing 3-5 steps with a railing? : None 6 Click Score: 24    End of Session Equipment Utilized During Treatment: Back brace Activity Tolerance: Patient tolerated treatment well Patient left: in chair;with call bell/phone within reach;with family/visitor present Nurse Communication: Mobility status PT Visit Diagnosis: Difficulty in walking, not elsewhere classified (R26.2);Pain Pain - part of body:  (back)     Time: 7034-0352 PT Time Calculation (min) (ACUTE ONLY): 28 min  Charges:  $Gait Training: 23-37 mins                     Todd Santana, PT, DPT Acute Rehabilitation Services Pager: [phone] Office: [phone]    Todd Santana 01/29/2020, 9:10 AM

## 2020-01-29 NOTE — Progress Notes (Signed)
Occupational Therapy Treatment Patient Details Name: LEJUAN BOTTO MRN: [id number] DOB: 1970/10/09 Today's Date: 01/29/2020    History of present illness Pt is a 49 y.o. male s/p decompression and fusion at L4/5. PMH consists of previou S1-L5 fusion in 2016.   OT comments  Pt continues to progress towards established OT goals. Providing and reviewing education on compensatory techniques for ADLs. Pt verbalized and demonstrated understanding. Pt planning for wife to assist with LB ADLs until soreness decreases. Recommend dc to home once medically stable. All acute OT needs met.    Follow Up Recommendations  No OT follow up;Supervision/Assistance - 24 hour    Equipment Recommendations  3 in 1 bedside commode    Recommendations for Other Services      Precautions / Restrictions Precautions Precautions: Back Precaution Booklet Issued: Yes (comment) Precaution Comments: Reviewed 3/3 back precautions. Pt has handout in room. Required Braces or Orthoses: Spinal Brace Spinal Brace: Thoracolumbosacral orthotic;Applied in sitting position Restrictions Weight Bearing Restrictions: No       Mobility Bed Mobility               General bed mobility comments: sitting up at EOB upon arrival  Transfers Overall transfer level: Needs assistance Equipment used: Rolling walker (2 wheeled) Transfers: Sit to/from Stand Sit to Stand: Supervision         General transfer comment: Supervision for safety    Balance Overall balance assessment: Needs assistance Sitting-balance support: No upper extremity supported;Feet supported Sitting balance-Leahy Scale: Good     Standing balance support: Bilateral upper extremity supported;No upper extremity supported;During functional activity Standing balance-Leahy Scale: Fair Standing balance comment: static stand without UE support; with prefence for at least single UE support                           ADL either performed or  assessed with clinical judgement   ADL Overall ADL's : Needs assistance/impaired                                       General ADL Comments: Reviewed all ADLs and compensatory techniques. Pt still with soreness and difficulty perform firgure four method for LB ADLs. Pt verbalizd and demonstrated understanding grooming,brace management, bed mobility, LB ADLs, toileting, and functional transfers.     Vision       Perception     Praxis      Cognition Arousal/Alertness: Awake/alert Behavior During Therapy: WFL for tasks assessed/performed Overall Cognitive Status: Within Functional Limits for tasks assessed                                          Exercises Other Exercises Other Exercises: Pt instructed in trunk stretches per his request. Pt advised/educated on importance of not engaging in stretches/exercises until cleared by surgeon. Pt verbalizes understanding.   Shoulder Instructions       General Comments      Pertinent Vitals/ Pain       Pain Assessment: Faces Faces Pain Scale: Hurts little more Pain Location: back/sx site Pain Descriptors / Indicators: Grimacing;Operative site guarding Pain Intervention(s): Monitored during session;Limited activity within patient's tolerance;Repositioned  Home Living  Prior Functioning/Environment              Frequency  Min 2X/week        Progress Toward Goals  OT Goals(current goals can now be found in the care plan section)  Progress towards OT goals: Progressing toward goals  Acute Rehab OT Goals Patient Stated Goal: home today OT Goal Formulation: All assessment and education complete, DC therapy ADL Goals Pt Will Perform Grooming: with supervision;standing Pt Will Perform Lower Body Bathing: with min assist;sitting/lateral leans;sit to/from stand;with adaptive equipment;with caregiver independent in assisting Pt Will  Perform Upper Body Dressing: with set-up;sitting;standing Pt Will Perform Lower Body Dressing: with min assist;sit to/from stand;with caregiver independent in assisting;with adaptive equipment Pt Will Transfer to Toilet: with modified independence;ambulating Pt Will Perform Toileting - Clothing Manipulation and hygiene: sit to/from stand;with supervision  Plan All goals met and education completed, patient discharged from OT services    Co-evaluation                 AM-PAC OT "6 Clicks" Daily Activity     Outcome Measure   Help from another person eating meals?: None Help from another person taking care of personal grooming?: None Help from another person toileting, which includes using toliet, bedpan, or urinal?: A Little Help from another person bathing (including washing, rinsing, drying)?: A Lot Help from another person to put on and taking off regular upper body clothing?: None Help from another person to put on and taking off regular lower body clothing?: A Lot 6 Click Score: 19    End of Session Equipment Utilized During Treatment: Rolling walker;Back brace  OT Visit Diagnosis: Other abnormalities of gait and mobility (R26.89);Pain Pain - part of body:  (Back)   Activity Tolerance Patient tolerated treatment well   Patient Left in chair;with call bell/phone within reach   Nurse Communication Mobility status        Time: 0810-0823 OT Time Calculation (min): 13 min  Charges: OT General Charges $OT Visit: 1 Visit OT Treatments $Self Care/Home Management : 8-22 mins  [place], OTR/L Acute Rehab Pager: [phone] Office: [place] 01/29/2020, 8:48 AM

## 2020-01-29 NOTE — Progress Notes (Signed)
    Patient doing well  Has been ambulating and progressing well with PT/OT Preop leg pain resolved   Physical Exam: Vitals:   01/28/20 2317 01/29/20 0355  BP: 116/75 123/82  Pulse: 97 91  Resp: 20 20  Temp: 99 F (37.2 C) 98.8 F (37.1 C)  SpO2: 100% 99%    Dressing in place NVI  POD #2 s/p L4/5 decompression and fusion, doing well  - up with PT/OT, encourage ambulation - Percocet for pain, Robaxin for muscle spasms - d/c home today with f/u in 2 weeks

## 2020-02-17 NOTE — Discharge Summary (Signed)
Patient ID: Todd Santana MRN: [id number] DOB/AGE: October 30, 1970 50 y.o.  Admit date: 01/26/2020 Discharge date: 01/28/2020  Admission Diagnoses:  Active Problems:   Radiculopathy   Discharge Diagnoses:  Same  Past Medical History:  Diagnosis Date  . Allergy   . Lumbago 04/01/2009    Surgeries: Procedure(s): Left Lumbar four-five Transforaminal Lumbar Interbody Fusion with Instrumentation and Allograft on 01/27/2020   Consultants: None  Discharged Condition: Improved  Hospital Course: Todd Santana is an 50 y.o. male who was admitted 01/26/2020 for operative treatment of radiculopathy. Patient has severe unremitting pain that affects sleep, daily activities, and work/hobbies. After pre-op clearance the patient was taken to the operating room on 01/27/2020 and underwent  Procedure(s): Left Lumbar four-five Transforaminal Lumbar Interbody Fusion with Instrumentation and Allograft.    Patient was given perioperative antibiotics:  Anti-infectives (From admission, onward)   Start     Dose/Rate Route Frequency Ordered Stop   01/28/20 0400  ceFAZolin (ANCEF) IVPB 2g/100 mL premix        2 g 200 mL/hr over 30 Minutes Intravenous Every 8 hours 01/27/20 2233 01/28/20 1305       Patient was given sequential compression devices, early ambulation to prevent DVT.  Patient benefited maximally from hospital stay and there were no complications.    Recent vital signs: BP 129/76 (BP Location: Right Arm)   Pulse 97   Temp 98.7 F (37.1 C) (Oral)   Resp 16   Ht 6\' 2"  (1.88 m)   Wt 79.1 kg   SpO2 100%   BMI 22.39 kg/m    Discharge Medications:   Allergies as of 01/29/2020   No Known Allergies     Medication List    TAKE these medications   baclofen 10 MG tablet Commonly known as: LIORESAL Take 10 mg by mouth at bedtime.   loratadine 10 MG tablet Commonly known as: CLARITIN Take 10 mg by mouth daily as needed (seasonal allergies).   methocarbamol 500 MG  tablet Commonly known as: ROBAXIN Take 1 tablet (500 mg total) by mouth every 6 (six) hours as needed for muscle spasms.   oxyCODONE-acetaminophen 10-325 MG tablet Commonly known as: PERCOCET Take 2 tablets by mouth every 4 (four) hours as needed for pain. What changed: Another medication with the same name was added. Make sure you understand how and when to take each.   oxyCODONE-acetaminophen 5-325 MG tablet Commonly known as: PERCOCET/ROXICET Take 1-2 tablets by mouth every 4 (four) hours as needed for severe pain. What changed: You were already taking a medication with the same name, and this prescription was added. Make sure you understand how and when to take each.       Diagnostic Studies: DG Lumbar Spine 2-3 Views  Result Date: 01/27/2020 CLINICAL DATA:  L4-L5 TLIF. EXAM: DG C-ARM 1-60 MIN; LUMBAR SPINE - 2-3 VIEW FLUOROSCOPY TIME:  Fluoroscopy Time:  24 seconds Radiation Exposure Index (if provided by the fluoroscopic device): 10.87 mGy Number of Acquired Spot Images: 2 COMPARISON:  Preoperative CT. FINDINGS: Frontal and lateral fluoroscopic spot views of the lumbar spine obtained. Prior L5-S1 fusion hardware with extension to L4-L5 level. Interbody spacers in place. IMPRESSION: Fluoroscopic spot views during L4-L5 fusion. Electronically Signed   By: Keith Rake M.D.   On: 01/27/2020 20:02   DG Lumbar Spine 2-3 Views  Result Date: 01/26/2020 CLINICAL DATA:  History of ruptured disc, worsening back pain EXAM: LUMBAR SPINE - 2-3 VIEW COMPARISON:  05/20/2014 FINDINGS: Frontal and lateral  views of the lumbar spine are obtained. There are 5 non-rib-bearing lumbar type vertebral bodies, with stable postsurgical changes from discectomy and fusion at the L5/S1 level. Mild left convex curvature centered at L3. No acute fractures. There is mild progressive spondylosis at L4/L5. Remaining disc spaces are well preserved. IMPRESSION: 1. No acute bony abnormality. 2. Postsurgical changes  from L5/S1 fusion. 3. L4/L5 spondylosis. Electronically Signed   By: Randa Ngo M.D.   On: 01/26/2020 18:25   CT LUMBAR SPINE WO CONTRAST  Result Date: 01/27/2020 CLINICAL DATA:  Low back pain. History of prior lumbar surgery. No known injury. EXAM: CT LUMBAR SPINE WITHOUT CONTRAST TECHNIQUE: Multidetector CT imaging of the lumbar spine was performed without intravenous contrast administration. Multiplanar CT image reconstructions were also generated. COMPARISON:  MRI lumbar spine 01/26/2020. FINDINGS: Segmentation: Standard. Alignment: Trace retrolisthesis L4 on L5.  Otherwise maintained. Vertebrae: No fracture or focal pathologic process. The patient is status post L4-5 laminectomy and fusion. Hardware is intact and appropriately positioned. No evidence of loosening. There is osseous fusion across the disc interspace and a solid posterior fusion mass on the left. There is a small focus of posterior fusion on the right. Paraspinal and other soft tissues: The patient has a horseshoe kidney. Imaged intra-abdominal contents are otherwise negative. Disc levels: L1-2, L2-3, L3-4: Mild degenerative change without evidence of stenosis. L4-5: Disc bulge with a superimposed left subarticular recess protrusion. The patient's protrusion impinges on the left L5 root in the subarticular recess and mildly deforms the left aspect of the thecal sac. L5-S1: Status post laminectomy and fusion.  No stenosis. IMPRESSION: Disc bulge and superimposed left subarticular recess protrusion at L4-5 as seen on lumbar spine MRI yesterday. There is impingement on the descending left L5 root in the subarticular recess. Status post L5-S1 fusion. Hardware is intact without complicating feature. There is bridging bone across the disc interspace and solid fusion of left facets. Small focus of bony fusion the right facets also noted. Horseshoe kidney. Electronically Signed   By: Inge Rise M.D.   On: 01/27/2020 12:34   MR LUMBAR  SPINE WO CONTRAST  Result Date: 01/26/2020 CLINICAL DATA:  Low back pain EXAM: MRI LUMBAR SPINE WITHOUT CONTRAST TECHNIQUE: Multiplanar, multisequence MR imaging of the lumbar spine was performed. No intravenous contrast was administered. COMPARISON:  2011; 2016 images are unavailable at time of dictation FINDINGS: Motion artifact is present. Segmentation: Standard. Alignment:  Mild retrolisthesis at L4-L5. Vertebrae: Postoperative changes at L5-S1 with rods and pedicle screws, posterior decompression, and interbody spacer. Hardware is not well evaluated on this study and there is associated susceptibility artifact. No aggressive osseous lesion. Small foci of STIR hyperintensity within the L1 and L3 vertebral bodies may reflect hemangiomas. Conus medullaris and cauda equina: Conus extends to the L1-L2 level. Conus and cauda equina appear normal. No evidence of arachnoiditis. Paraspinal and other soft tissues: Chronic postoperative changes. Otherwise unremarkable. Disc levels: L1-L2: Trace right central disc protrusion. No canal or foraminal stenosis. L2-L3:  No canal or foraminal stenosis. L3-L4: Trace central disc protrusion. No canal or foraminal stenosis. L4-L5: Disc desiccation and mild height loss. Disc bulge with superimposed left subarticular zone protrusion. Facet arthropathy with ligamentum flavum infolding. No canal stenosis. Left subarticular recess stenosis with L5 nerve root compression. Minor foraminal stenosis. L5-S1: Fusion level. Canal and subarticular recesses are decompressed. Foramina are distorted by artifact with no apparent stenosis. IMPRESSION: Degenerative and postoperative changes as detailed above. Most notably, there is a disc protrusion at L4-L5  compressing the traversing left L5 nerve root. Electronically Signed   By: Macy Mis M.D.   On: 01/26/2020 18:25   DG Lumbar Spine 1 View  Result Date: 01/27/2020 CLINICAL DATA:  L4-L5 TLIF. EXAM: LUMBAR SPINE - 1 VIEW COMPARISON:   Preoperative imaging. FINDINGS: Single portable cross-table lateral view of the lumbar spine obtained in the operating room. There is prior posterior fusion L5-S1. Surgical instruments localize posterior to L4 and L4-L5 disc space. IMPRESSION: Surgical instruments localizing posterior to the L4 and L4-L5 disc space. Electronically Signed   By: Keith Rake M.D.   On: 01/27/2020 20:03   DG C-Arm 1-60 Min  Result Date: 01/27/2020 CLINICAL DATA:  L4-L5 TLIF. EXAM: DG C-ARM 1-60 MIN; LUMBAR SPINE - 2-3 VIEW FLUOROSCOPY TIME:  Fluoroscopy Time:  24 seconds Radiation Exposure Index (if provided by the fluoroscopic device): 10.87 mGy Number of Acquired Spot Images: 2 COMPARISON:  Preoperative CT. FINDINGS: Frontal and lateral fluoroscopic spot views of the lumbar spine obtained. Prior L5-S1 fusion hardware with extension to L4-L5 level. Interbody spacers in place. IMPRESSION: Fluoroscopic spot views during L4-L5 fusion. Electronically Signed   By: Keith Rake M.D.   On: 01/27/2020 20:02    Disposition: Discharge disposition: 01-Home or Self Care        POD #1 s/p decompression and fusion at L4/5 with resolved preop pain, with left leg numbness and tingling  - up with PT/OT, encourage ambulation - Percocet for pain, Robaxin for muscle spasms - Will follow symptoms of numbness and tingling as this is likely residual radiculopathy, with no signs of CES. This is very likely to improve, as is right thigh numbness, which is very likely positional. -Scripts for pain sent to pharmacy electronically  -D/C instructions sheet printed and in chart -D/C today  -F/U in office 2 weeks   Signed: Justice [place] 02/17/2020, 10:37 AM
